# Patient Record
Sex: Female | Born: 1985 | Race: White | Hispanic: Yes | Smoking: Never smoker
Health system: Southern US, Community
[De-identification: ages and names within clinical notes are randomized; demographics above are authoritative.]

## PROBLEM LIST (undated history)

## (undated) ENCOUNTER — Inpatient Hospital Stay (HOSPITAL_COMMUNITY): Payer: Self-pay

## (undated) DIAGNOSIS — Z789 Other specified health status: Secondary | ICD-10-CM

## (undated) HISTORY — PX: NO PAST SURGERIES: SHX2092

---

## 2011-01-19 ENCOUNTER — Emergency Department (HOSPITAL_COMMUNITY)
Admission: EM | Admit: 2011-01-19 | Discharge: 2011-01-19 | Disposition: A | Discharge status: Home / Self Care | Payer: Self-pay | Source: Home / Self Care | Attending: Family Medicine | Admitting: Family Medicine

## 2011-01-19 ENCOUNTER — Encounter: Payer: Self-pay | Admitting: Cardiology

## 2011-01-19 DIAGNOSIS — N390 Urinary tract infection, site not specified: Secondary | ICD-10-CM

## 2011-01-19 MED ORDER — CEPHALEXIN 500 MG PO CAPS: 500.0000 mg | ORAL_CAPSULE | Freq: Four times a day (QID) | ORAL | Status: AC

## 2011-01-19 NOTE — ED Notes (Signed)
Pt reports burning on urination with urgency and frequency since Sunday. Denies fever.

## 2011-01-19 NOTE — ED Provider Notes (Signed)
History     CSN: [id number] Arrival date & time: 01/19/2011  7:06 PM   First MD Initiated Contact with Patient 01/19/11 1822      Chief Complaint  Patient presents with  . Urinary Urgency  . Urinary Frequency    (Consider location/radiation/quality/duration/timing/severity/associated sxs/prior treatment) Patient is a 25 y.o. female presenting with abdominal pain. The history is provided by the patient. A language interpreter was used.  Abdominal Pain The primary symptoms of the illness include abdominal pain and dysuria. The primary symptoms of the illness do not include fever, nausea, vomiting, diarrhea, vaginal discharge or vaginal bleeding. The current episode started more than 2 days ago. The onset of the illness was gradual. The problem has not changed since onset. The dysuria is associated with frequency and urgency.  The patient states that she believes she is currently not pregnant. The patient has not had a change in bowel habit. Additional symptoms associated with the illness include urgency, frequency and back pain.    History reviewed. No pertinent past medical history.  History reviewed. No pertinent past surgical history.  Family History  Problem Relation Age of Onset  . Diabetes Other     History  Substance Use Topics  . Smoking status: Never Smoker   . Smokeless tobacco: Not on file  . Alcohol Use: No    OB History    Grav Para Term Preterm Abortions TAB SAB Ect Mult Living                  Review of Systems  Constitutional: Negative for fever.  Gastrointestinal: Positive for abdominal pain. Negative for nausea, vomiting and diarrhea.  Genitourinary: Positive for dysuria, urgency and frequency. Negative for vaginal bleeding and vaginal discharge.  Musculoskeletal: Positive for back pain.    Allergies  Review of patient's allergies indicates no known allergies.  Home Medications   Current Outpatient Rx  Name Route Sig Dispense Refill  .  CEPHALEXIN 500 MG PO CAPS Oral Take 1 capsule (500 mg total) by mouth 4 (four) times daily. Take all of medicine and drink lots of fluids 20 capsule 0    BP 127/76  Pulse 85  Temp(Src) 98.4 F (36.9 C) (Oral)  Resp 16  SpO2 100%  LMP 01/05/2011  Physical Exam  Nursing note and vitals reviewed. Constitutional: She appears well-developed and well-nourished.  Abdominal: Soft. Bowel sounds are normal. There is tenderness in the suprapubic area. There is no rigidity, no rebound, no guarding and no CVA tenderness.    ED Course  Procedures (including critical care time)   Labs Reviewed  POCT PREGNANCY, URINE  POCT PREGNANCY, URINE  POCT URINALYSIS DIPSTICK   No results found.   1. UTI (lower urinary tract infection)       MDM  U/a abnl.,   preg neg.        Barkley Bruns, MD 01/19/11 [phone]

## 2011-01-20 LAB — POCT URINALYSIS DIP (DEVICE)
Bilirubin Urine: NEGATIVE
Glucose, UA: NEGATIVE mg/dL
Ketones, ur: NEGATIVE mg/dL
Nitrite: NEGATIVE
Specific Gravity, Urine: 1.015 (ref 1.005–1.030)

## 2011-06-12 ENCOUNTER — Encounter (HOSPITAL_COMMUNITY): Payer: Self-pay

## 2011-06-12 ENCOUNTER — Emergency Department (HOSPITAL_COMMUNITY)
Admission: EM | Admit: 2011-06-12 | Discharge: 2011-06-12 | Disposition: A | Discharge status: Home / Self Care | Payer: Self-pay | Source: Home / Self Care | Attending: Family Medicine | Admitting: Family Medicine

## 2011-06-12 DIAGNOSIS — L309 Dermatitis, unspecified: Secondary | ICD-10-CM

## 2011-06-12 DIAGNOSIS — L259 Unspecified contact dermatitis, unspecified cause: Secondary | ICD-10-CM

## 2011-06-12 DIAGNOSIS — B356 Tinea cruris: Secondary | ICD-10-CM

## 2011-06-12 MED ORDER — KETOCONAZOLE 2 % EX CREA: TOPICAL_CREAM | Freq: Every day | CUTANEOUS | Status: AC

## 2011-06-12 MED ORDER — MOMETASONE FUROATE 0.1 % EX CREA: TOPICAL_CREAM | Freq: Two times a day (BID) | CUTANEOUS | Status: AC

## 2011-06-12 MED ORDER — FLUCONAZOLE 150 MG PO TABS: ORAL_TABLET | ORAL | Status: AC

## 2011-06-12 NOTE — ED Provider Notes (Signed)
History     CSN: [id number]  Arrival date & time 06/12/11  4098   First MD Initiated Contact with Patient 06/12/11 1013      Chief Complaint  Patient presents with  . Rash    (Consider location/radiation/quality/duration/timing/severity/associated sxs/prior treatment) Patient is a 26 y.o. female presenting with rash. The history is provided by the patient and the spouse. The history is limited by a language barrier. No language interpreter was used.  Rash  This is a new problem. The current episode started more than 2 days ago. The problem has been gradually worsening. The problem is associated with nothing. There has been no fever. The rash is present on the left hand, right hand, left foot, right foot, genitalia, left upper leg and right upper leg. The patient is experiencing no pain. The pain has been worsening since onset. Associated symptoms include itching. She has tried nothing for the symptoms.    History reviewed. No pertinent past medical history.  History reviewed. No pertinent past surgical history.  Family History  Problem Relation Age of Onset  . Diabetes Other     History  Substance Use Topics  . Smoking status: Never Smoker   . Smokeless tobacco: Not on file  . Alcohol Use: No    OB History    Grav Para Term Preterm Abortions TAB SAB Ect Mult Living                  Review of Systems  HENT: Positive for congestion, sneezing and postnasal drip.   Skin: Positive for itching and rash.  All other systems reviewed and are negative.    Allergies  Review of patient's allergies indicates no known allergies.  Home Medications   Current Outpatient Rx  Name Route Sig Dispense Refill  . OVER THE COUNTER MEDICATION  Itching cream otc      LMP 05/25/2011  Physical Exam  Constitutional: She is oriented to person, place, and time. She appears well-developed and well-nourished.  HENT:  Head: Normocephalic.  Neck: Neck supple.  Musculoskeletal: Normal  range of motion.  Neurological: She is alert and oriented to person, place, and time.  Skin: Skin is warm and intact. Lesion and rash noted. There is erythema.       2 different types of rashes present. #1 feet and hands demonstrate tapioca-like lesions consistent with eczema. #2 over the genital and thigh area is a thickened red rash consistent with tinea cruiz Explained to patient that the genital rash has probably been going on for more than 3 or 4 days in husband confirms that she can has been going to the gym lately and sweating more.  Psychiatric: She has a normal mood and affect.    ED Course  Procedures (including critical care time)  Assessment and plan: Tinea cruiz and eczema. Will treat the tinea with Nizoral cream and one Diflucan tablet, we'll treat the eczema Elocon cream apply twice a day when necessary followup with PCP if not better in 2 weeks.Marland Kitchen      MDM          Hassan Rowan, MD 06/12/11 1141

## 2011-06-12 NOTE — ED Notes (Signed)
Pt has itchy rash on hands, feet and groin that started on Wednesday.

## 2011-06-12 NOTE — Discharge Instructions (Signed)
Prurito en la Ingle (Jock Itch) Prurito en la ingle es una infeccin por hongos en la piel de la ingle. A veces se lo denomina "culebrilla" pero no proviene de un gusano. Un hongo es un tipo de germen que crece en lugares oscuros y hmedos.  CAUSAS La infeccin podr diseminarse:  Por infeccin de hongos en cualquier parte del cuerpo (como el pie de atleta).   Compartir toallas o ropa.  Esta infeccin es ms frecuente en:  Climas clidos y hmedos.   Personas que [place] ropa [place] o trajes de bao hmedos por [employer].   Deportistas.   Personas con sobrepeso.   Personas con diabtes.  SNTOMAS Prurito en la ingle causa los siguientes sntomas:  Puntos rojos, rosados o marrones en la ingle. Puede expandirse a los muslos, nalgas y ano.   Picazn.  DIAGNSTICO El profesional hace el diagnstico a travs de la observacin de la erupcin. A veces se realiza un raspado de la piel para realizar un anlisis. El anlisis se obtiene mirando el microscopio o mediante un cultivo (prueba para Therapist, music). Este puede tomar [employer]. TRATAMIENTO Prurito en la ingle puede tratarse:  Cremas para la piel o pomadas para matar hongos.   Medicamentos por va oral que destruyen los hongos.   Cremas para la piel o pomadas para calmar la picazn.   Compresas o polvos con medicamentos para secar la piel infectada.  INSTRUCCIONES PARA EL CUIDADO DOMICILIARIO  Asegrese de que la erupcin desaparezca completamente con el tratamiento. Siga las indicaciones del mdico. Puede llevar un par de semanas completar la curacin. Si no se trata durante el tiempo suficiente, esta lesin Metallurgist.   Use ropas sueltas.   Los hombres deben usar boxers de algodn cortos.   Las mujeres deben usar ropa interior de algodn.   Evite los baos calientes.   Seque la zona de la ingle luego del bao.  SOLICITE ANTENCIN MDICA SI:  La erupcin empeora.   Se  extiende.   Aparece nuevamente luego de completar el tratamiento.   No se cura en el trmino de 4 semanas. Las infecciones micticas son lentas en responder al TEFL teacher. Persiste un enrojecimiento durante varias semanas despus de que el hongo haya desaparecido.  SOLICITE ATENCIN MDICA DE INMEDIATO SI:  La zona se vuelve roja, caliente, se hincha o duele.   Tiene fiebre.  Document Released: 10/27/2004 Document Revised: 01/06/2011 [facility] Patient Information 2012 [place], [place].Eczema (Eczema) El eczema, o dermatitis atpica, es un tipo heredado de piel sensible. Generalmente las personas que sufren eczema tienen una historia familiar de [place], asma o fiebre de heno. Este trastorno ocasiona una erupcin que pica y la piel se observa seca y escamosa. La picazn puede aparecer antes del sarpullido y puede ser muy intensa. Esta enfermedad no es contagiosa. El eczema generalmente empeora durante los meses fros del invierno y generalmente desaparece o mejora con el tiempo clido del verano. El eczema suele comenzar a mostrar signos en la infancia. Algunos nios desarrollan este trastorno y ste puede prolongarse en la Estate manager/land agent. Las causas de los brotes pueden ser:  Comer o tener contacto con algo a lo que se es Best boy.   El estrs.  DIAGNSTICO El diagnstico de eczema se basa generalmente en los sntomas y en la historia clnica. TRATAMIENTO El eczema no puede curarse, pero los sntomas podrn controlarse con tratamiento o evitando los alergenos (sustancias a las que es sensible o Best boy).  Controle la picazn y  el rascarse.   Utilice antihistamnicos de venta libre segn las indicaciones, para Associate Professor. Es especialmente til por las noches cuando la picazn tiende a Theme park manager.   Utilizar cremas esteorideas de venta libre segn se la haya indicado para la picazn.   Si se rasca, har que la erupcin y la picazn empeoren y esto puede causar imptigo (una infeccin de la  piel) si las uas estn contaminadas (sucias).   Mantener [employer] la piel hmeda con cremas. La piel quedar hmeda y ayudar a prevenir la sequedad. Las lociones que contienen alcohol y agua pueden secar la piel y no se recomiendan.   Limite la exposicin a alergenos.   Reconozca las situaciones que producen estrs.   Desarrolle un plan para controlar el estrs.  INSTRUCCIONES PARA EL CUIDADO DOMICILIARIO  Tome slo medicamentos de venta libre o prescriptos, segn las indicaciones del mdico.   No utilice ningn producto en la piel sin consultarlo antes con el profesional.   El nio deber tomar baos o duchas de corta duracin (5 minutos) en agua templada (no caliente). Use productos suaves para el bao. Puede agregar aceite de bao no perfumado al agua del bao. Lo mejor es evitar el jabn y el bao de espuma.   Inmediatamente despus del bao o de la ducha, cuando la piel an est hmeda, aplique una crema humectante en todo el cuerpo. Esta crema debe ser Neomia Dear pomada de vaselina. La piel quedar hmeda y ayudar a prevenir la sequedad. Cundo ms espesa sea la crema, mejor. No deben ser perfumadas.   Mantengas las uas cortas y lvese las manos con frecuencia. Si el nio tiene eczema, podr ser [employer] coloque unos guantes o mitones suaves a la noche.   Vista al [employer] con ropa de algodn o Chief of Staff de algodn. Pngale ropas livianas, ya que el calor aumenta la picazn.   Evite los alimentos que le producen [place]. Entre los [employer] pueden causar un brote se incluyen la [place], la [place] de man, los huevos y el trigo.   Mantenga al nio lejos de quien tenga ampollas febriles. El virus que causa las ampollas febriles (herpes simple) puede ocasionar una infeccin grave en la piel de los nios que padecen eczema.  SOLICITE ATENCIN MDICA SI:  La picazn le impide dormir.   La erupcin empeora o no mejora dentro de la semana en la que se inicia el  [name].   La erupcin se ve infectada (pus o costras de color amarillo claro).   Usted o su hijo tienen una temperatura oral de ms de 102 F (38.9 C).   El beb tiene ms de 3 meses y su temperatura rectal es de 100.5 F (38.1 C) o ms durante ms de 1 da.   Aparece un brote despus de haber estado en contacto con alguna persona que tiene ampollas febriles.  SOLICITE ATENCIN MDICA DE INMEDIATO SI:  Su beb tiene ms de 3 meses y su temperatura rectal es de 102 F (38.9 C) o ms.   Su beb tiene 3 meses o menos y su temperatura rectal es de 100.4 F (38 C) o ms.  Document Released: 01/17/2005 Document Revised: 01/06/2011 [facility] Patient Information 2012 [place], [place].

## 2012-08-04 ENCOUNTER — Inpatient Hospital Stay (HOSPITAL_COMMUNITY)
Admission: AD | Admit: 2012-08-04 | Discharge: 2012-08-04 | Disposition: A | Discharge status: Home / Self Care | Payer: Self-pay | Source: Ambulatory Visit | Attending: Obstetrics & Gynecology | Admitting: Obstetrics & Gynecology

## 2012-08-04 ENCOUNTER — Encounter (HOSPITAL_COMMUNITY): Payer: Self-pay | Admitting: *Deleted

## 2012-08-04 ENCOUNTER — Inpatient Hospital Stay (HOSPITAL_COMMUNITY): Payer: Self-pay

## 2012-08-04 DIAGNOSIS — Z1389 Encounter for screening for other disorder: Secondary | ICD-10-CM

## 2012-08-04 DIAGNOSIS — O99891 Other specified diseases and conditions complicating pregnancy: Secondary | ICD-10-CM | POA: Insufficient documentation

## 2012-08-04 DIAGNOSIS — Z3490 Encounter for supervision of normal pregnancy, unspecified, unspecified trimester: Secondary | ICD-10-CM

## 2012-08-04 DIAGNOSIS — R109 Unspecified abdominal pain: Secondary | ICD-10-CM | POA: Insufficient documentation

## 2012-08-04 HISTORY — DX: Other specified health status: Z78.9

## 2012-08-04 LAB — URINE MICROSCOPIC-ADD ON

## 2012-08-04 LAB — CBC WITH DIFFERENTIAL/PLATELET
Basophils Relative: 0 % (ref 0–1)
Eosinophils Absolute: 0.3 10*3/uL (ref 0.0–0.7)
MCH: 31.1 pg (ref 26.0–34.0)
MCHC: 35.5 g/dL (ref 30.0–36.0)
Neutrophils Relative %: 69 % (ref 43–77)
Platelets: 231 10*3/uL (ref 150–400)
RBC: 4.4 MIL/uL (ref 3.87–5.11)

## 2012-08-04 LAB — URINALYSIS, ROUTINE W REFLEX MICROSCOPIC
Nitrite: NEGATIVE
Specific Gravity, Urine: 1.01 (ref 1.005–1.030)
Urobilinogen, UA: 0.2 mg/dL (ref 0.0–1.0)
pH: 6 (ref 5.0–8.0)

## 2012-08-04 LAB — WET PREP, GENITAL
Trich, Wet Prep: NONE SEEN
Yeast Wet Prep HPF POC: NONE SEEN

## 2012-08-04 LAB — POCT PREGNANCY, URINE: Preg Test, Ur: POSITIVE — AB

## 2012-08-04 LAB — HCG, QUANTITATIVE, PREGNANCY: hCG, Beta Chain, Quant, S: 22027 m[IU]/mL — ABNORMAL HIGH (ref <5)

## 2012-08-04 NOTE — MAU Provider Note (Addendum)
History     CSN: [id number]  Arrival date and time: 08/04/12 1478   First Provider Initiated Contact with Patient 08/04/12 2008      Chief Complaint  Patient presents with  . Abdominal Pain   HPI Tracie [name] is 27 y.o. G2P1 Unknown weeks presenting with + HPT 2 weeks ago.  Presents with "coffee color" discharge X 2 days; cramping X 2 weeks but stronger for 2 days.  Denies active vaginal bleeding. Last intercourse 8 days ago.   Denies UTI sxs.   Off OCPs 1 year ago without period since.  Hx of irregular cycles.  Interpreter in for HPI and exam.  Past Medical History  Diagnosis Date  . Medical history non-contributory     Past Surgical History  Procedure Laterality Date  . No past surgeries      Family History  Problem Relation Age of Onset  . Diabetes Other     History  Substance Use Topics  . Smoking status: Never Smoker   . Smokeless tobacco: Not on file  . Alcohol Use: No    Allergies: No Known Allergies  Prescriptions prior to admission  Medication Sig Dispense Refill  . OVER THE COUNTER MEDICATION Itching cream otc        Review of Systems  Constitutional: Negative for fever and chills.  Gastrointestinal: Positive for abdominal pain (cramping). Negative for nausea and vomiting.  Genitourinary: Negative for dysuria, urgency, frequency, hematuria and flank pain.       + vaginal bleeding  Neurological: Negative for headaches.   Physical Exam   Blood pressure 124/78, pulse 81, temperature 98 F (36.7 C), resp. rate 18, height 5' 1.5" (1.562 m), weight 167 lb 12.8 oz (76.114 kg), SpO2 100.00%.  Physical Exam  Constitutional: She is oriented to person, place, and time. She appears well-developed and well-nourished. No distress.  HENT:  Head: Normocephalic.  Neck: Normal range of motion.  Cardiovascular: Normal rate.   Respiratory: Effort normal.  GI: Soft. She exhibits no distension and no mass. There is no tenderness. There is no rebound and no guarding.   Genitourinary: There is no rash, tenderness or injury on the right labia. There is no rash, tenderness, lesion or injury on the left labia. Uterus is enlarged (soft but small). Uterus is not tender. Right adnexum displays no mass, no tenderness and no fullness. Left adnexum displays no mass, no tenderness and no fullness. There is bleeding (small amount of pinkish brown discharge,  CLumps of discharge without odor.) around the vagina. No erythema or tenderness around the vagina. No foreign body around the vagina. No signs of injury around the vagina. No vaginal discharge found.  There is a small abrasion that is red on the cervix at 6:00.    Neurological: She is alert and oriented to person, place, and time.  Skin: Skin is warm and dry.  Psychiatric: She has a normal mood and affect. Her behavior is normal.   Results for orders placed during the hospital encounter of 08/04/12 (from the past 24 hour(s))  URINALYSIS, ROUTINE W REFLEX MICROSCOPIC     Status: Abnormal   Collection Time    08/04/12  7:40 PM      Result Value Range   Color, Urine YELLOW  YELLOW   APPearance CLEAR  CLEAR   Specific Gravity, Urine 1.010  1.005 - 1.030   pH 6.0  5.0 - 8.0   Glucose, UA NEGATIVE  NEGATIVE mg/dL   Hgb urine dipstick MODERATE (*) NEGATIVE  Bilirubin Urine NEGATIVE  NEGATIVE   Ketones, ur NEGATIVE  NEGATIVE mg/dL   Protein, ur NEGATIVE  NEGATIVE mg/dL   Urobilinogen, UA 0.2  0.0 - 1.0 mg/dL   Nitrite NEGATIVE  NEGATIVE   Leukocytes, UA SMALL (*) NEGATIVE  URINE MICROSCOPIC-ADD ON     Status: None   Collection Time    08/04/12  7:40 PM      Result Value Range   Squamous Epithelial / LPF RARE  RARE   WBC, UA 3-6  <3 WBC/hpf   RBC / HPF 3-6  <3 RBC/hpf   Bacteria, UA RARE  RARE   Urine-Other YEAST    POCT PREGNANCY, URINE     Status: Abnormal   Collection Time    08/04/12  7:46 PM      Result Value Range   Preg Test, Ur POSITIVE (*) NEGATIVE  CBC WITH DIFFERENTIAL     Status: Abnormal    Collection Time    08/04/12  8:12 PM      Result Value Range   WBC 12.6 (*) 4.0 - 10.5 K/uL   RBC 4.40  3.87 - 5.11 MIL/uL   Hemoglobin 13.7  12.0 - 15.0 g/dL   HCT 47.8  29.5 - 62.1 %   MCV 87.7  78.0 - 100.0 fL   MCH 31.1  26.0 - 34.0 pg   MCHC 35.5  30.0 - 36.0 g/dL   RDW 30.8  65.7 - 84.6 %   Platelets 231  150 - 400 K/uL   Neutrophils Relative % 69  43 - 77 %   Neutro Abs 8.8 (*) 1.7 - 7.7 K/uL   Lymphocytes Relative 23  12 - 46 %   Lymphs Abs 3.0  0.7 - 4.0 K/uL   Monocytes Relative 5  3 - 12 %   Monocytes Absolute 0.6  0.1 - 1.0 K/uL   Eosinophils Relative 2  0 - 5 %   Eosinophils Absolute 0.3  0.0 - 0.7 K/uL   Basophils Relative 0  0 - 1 %   Basophils Absolute 0.0  0.0 - 0.1 K/uL  WET PREP, GENITAL     Status: Abnormal   Collection Time    08/04/12  8:15 PM      Result Value Range   Yeast Wet Prep HPF POC NONE SEEN  NONE SEEN   Trich, Wet Prep NONE SEEN  NONE SEEN   Clue Cells Wet Prep HPF POC NONE SEEN  NONE SEEN   WBC, Wet Prep HPF POC FEW (*) NONE SEEN  HCG, QUANTITATIVE, PREGNANCY     Status: Abnormal   Collection Time    08/04/12  8:21 PM      Result Value Range   hCG, Beta Chain, Quant, S 22027 (*) <5 mIU/mL  ABO/RH     Status: None   Collection Time    08/04/12  8:21 PM      Result Value Range   ABO/RH(D) O POS     MAU Course  Procedures  GC/CHL culture to lab  MDM Care turned over to [place], CNM at [facility]  Assessment and Plan    KEY,EVE M 08/04/2012, 8:48 PM   US Ob Comp Less 14 Wks  08/04/2012   *RADIOLOGY REPORT*  Clinical Data: Pelvic pain for 2 weeks.  Brown discharge. Quantitative beta HCG is 22,027.  Unknown last menstrual period.  OBSTETRIC <14 WK [place] AND TRANSVAGINAL OB US  Technique:  Both transabdominal and transvaginal ultrasound examinations were performed for complete  evaluation of the gestation as well as the maternal uterus, adnexal regions, and pelvic cul-de-sac.  Transvaginal technique was performed to assess early pregnancy.   Comparison:  None.  Intrauterine gestational sac:  A single intrauterine gestational sac is visualized. Yolk sac: The yolk sac is visualized. Embryo: Provisional identification of a fetal pole, not well seen. Cardiac Activity: Possible identification of fetal cardiac activity. Heart Rate: 53 - 75 bpm  MSD: 16 mm  6 w 3 d           USEDC: 03/27/2013  CRL: 2.1  mm  5 w  6 d        [place] EDC: 03/31/2013  Maternal uterus/adnexae: No focal myometrial masses.  Uterus is anteverted.  No subchorionic hemorrhage.  Both ovaries are visualized without abnormal adnexal mass.  Right ovary measures 3.3 x 2.9 x 2.7 cm.  Left ovary measures 2.8 x 1.8 x 2.3 cm.  IMPRESSION: Single intrauterine gestational sac and yolk sac are visualized. Provisional identification of a small fetal pole and probable fetal cardiac activity.  Estimated gestational age is between 5-week 6 days and 6 weeks 3 days.   Original Report Authenticated By: Burman Nieves, M.D.   US Ob Transvaginal  08/04/2012   *RADIOLOGY REPORT*  Clinical Data: Pelvic pain for 2 weeks.  Brown discharge. Quantitative beta HCG is 22,027.  Unknown last menstrual period.  OBSTETRIC <14 WK [place] AND TRANSVAGINAL OB US  Technique:  Both transabdominal and transvaginal ultrasound examinations were performed for complete evaluation of the gestation as well as the maternal uterus, adnexal regions, and pelvic cul-de-sac.  Transvaginal technique was performed to assess early pregnancy.  Comparison:  None.  Intrauterine gestational sac:  A single intrauterine gestational sac is visualized. Yolk sac: The yolk sac is visualized. Embryo: Provisional identification of a fetal pole, not well seen. Cardiac Activity: Possible identification of fetal cardiac activity. Heart Rate: 53 - 75 bpm  MSD: 16 mm  6 w 3 d           USEDC: 03/27/2013  CRL: 2.1  mm  5 w  6 d        [place] EDC: 03/31/2013  Maternal uterus/adnexae: No focal myometrial masses.  Uterus is anteverted.  No subchorionic hemorrhage.  Both  ovaries are visualized without abnormal adnexal mass.  Right ovary measures 3.3 x 2.9 x 2.7 cm.  Left ovary measures 2.8 x 1.8 x 2.3 cm.  IMPRESSION: Single intrauterine gestational sac and yolk sac are visualized. Provisional identification of a small fetal pole and probable fetal cardiac activity.  Estimated gestational age is between 5-week 6 days and 6 weeks 3 days.   Original Report Authenticated By: Burman Nieves, M.D.    A: 1. Normal IUP (intrauterine pregnancy) on prenatal ultrasound    P: D/C home with bleeding precautions F/U with early prenatal care.  Pt plans to start care at health dept. Return to MAU as needed [facility] Spanish translator used for all communication.   Sharen Counter Certified Nurse-Midwife

## 2012-08-04 NOTE — MAU Note (Signed)
Stopped taking birth control pills a year ago has had no periods since stopped taking, was on them for four years

## 2012-08-04 NOTE — MAU Note (Signed)
I'm having lower abd pain, esp when i sit. Some coffee colored vag d/c. Pain has been present for couple wks but worse past 2 days. Vag d/c started yesterday afternoon.

## 2012-10-08 ENCOUNTER — Other Ambulatory Visit (HOSPITAL_COMMUNITY): Payer: Self-pay | Admitting: Physician Assistant

## 2012-10-08 DIAGNOSIS — Z3689 Encounter for other specified antenatal screening: Secondary | ICD-10-CM

## 2012-10-25 ENCOUNTER — Encounter: Payer: Self-pay | Admitting: Obstetrics & Gynecology

## 2012-10-25 ENCOUNTER — Encounter: Payer: Self-pay | Admitting: *Deleted

## 2012-10-25 ENCOUNTER — Ambulatory Visit (INDEPENDENT_AMBULATORY_CARE_PROVIDER_SITE_OTHER): Payer: No Typology Code available for payment source | Admitting: Obstetrics & Gynecology

## 2012-10-25 VITALS — BP 118/71 | Temp 97.8°F | Wt 167.0 lb

## 2012-10-25 DIAGNOSIS — O09899 Supervision of other high risk pregnancies, unspecified trimester: Secondary | ICD-10-CM

## 2012-10-25 DIAGNOSIS — O0992 Supervision of high risk pregnancy, unspecified, second trimester: Secondary | ICD-10-CM | POA: Insufficient documentation

## 2012-10-25 NOTE — Patient Instructions (Signed)
Embarazo - Segundo trimestre (Pregnancy - Second Trimester) El segundo trimestre del embarazo (del 3 al 6 mes) es un perodo de evolucin rpida para usted y el beb. Hacia el final del sexto mes, el beb mide aproximadamente 23 cm y pesa 680 g. Comenzar a sentir los movimientos del beb entre las 18 y las 20 semanas de embarazo. Podr sentir las pataditas ("quickening en ingls"). Hay un rpido aumento de peso. Puede segregar un lquido claro (calostro) de las mamas. Quizs sienta pequeas contracciones en el vientre (tero) Esto se conoce como falso trabajo de parto o contracciones de Braxton-Hicks. Es como una prctica del trabajo de parto que se produce cuando el beb est listo para salir. Generalmente los problemas de vmitos matinales ya se han superado hacia el final del primer trimestre. Algunas mujeres desarrollan pequeas manchas oscuras (que se denominan cloasma, mscara del embarazo) en la cara que normalmente se van luego del nacimiento del beb. La exposicin al sol empeora las manchas. Puede desarrollarse acn en algunas mujeres embarazadas, y puede desaparecer en aquellas que ya tienen acn. EXAMENES PRENATALES  Durante los exmenes prenatales, deber seguir realizando pruebas de sangre, segn avance el embarazo. Estas pruebas se realizan para controlar su salud y la del beb. Tambin se realizan anlisis de sangre para conocer los niveles de hemoglobina. La anemia (bajo nivel de hemoglobina) es frecuente durante el embarazo. Para prevenirla, se administran hierro y vitaminas. Tambin se le realizarn exmenes para saber si tiene diabetes entre las 24 y las 28 semanas del embarazo. Podrn repetirle algunas de las pruebas que le hicieron previamente.  En cada visita le medirn el tamao del tero. Esto se realiza para asegurarse de que el beb est creciendo correctamente de acuerdo al estado del embarazo.  Tambin en cada visita prenatal controlarn su presin arterial. Esto se realiza  para asegurarse de que no tenga toxemia.  Se controlar su orina para asegurarse de que no tenga infecciones, diabetes o protena en la orina.  Se controlar su peso regularmente para asegurarse que el aumento ocurre al ritmo indicado. Esto se hace para asegurarse que usted y el beb tienen una evolucin normal.  En algunas ocasiones se realiza una prueba de ultrasonido para confirmar el correcto desarrollo y evolucin del beb. Esta prueba se realiza con ondas sonoras inofensivas para el beb, de modo que el profesional pueda calcular ms precisamente la fecha del parto. Algunas veces se realizan pruebas especializadas del lquido amnitico que rodea al beb. Esta prueba se denomina amniocentesis. El lquido amnitico se obtiene introduciendo una aguja en el vientre (abdomen). Se realiza para controlar los cromosomas en aquellos casos en los que existe alguna preocupacin acerca de algn problema gentico que pueda sufrir el beb. En ocasiones se lleva a cabo cerca del final del embarazo, si es necesario inducir al parto. En este caso se realiza para asegurarse que los pulmones del beb estn lo suficientemente maduros como para que pueda vivir fuera del tero. CAMBIOS QUE OCURREN EN EL SEGUNDO TRIMESTRE DEL EMBARAZO Su organismo atravesar numerosos cambios durante el embarazo. Estos pueden variar de una persona a otra. Converse con el profesional que la asiste acerca los cambios que usted note y que la preocupen.  Durante el segundo trimestre probablemente sienta un aumento del apetito. Es normal tener "antojos" de ciertas comidas. Esto vara de una persona a otra y de un embarazo a otro.  El abdomen inferior comenzar a abultarse.  Podr tener la necesidad de orinar con ms frecuencia debido a   que el tero y el beb presionan sobre la vejiga. Tambin es frecuente contraer ms infecciones urinarias durante el embarazo. Puede evitarlas bebiendo gran cantidad de lquidos y vaciando la vejiga antes y  despus de mantener relaciones sexuales.  Podrn aparecer las primeras estras en las caderas, abdomen y mamas. Estos son cambios normales del cuerpo durante el embarazo. No existen medicamentos ni ejercicios que puedan prevenir estos cambios.  Es posible que comience a desarrollar venas inflamadas y abultadas (varices) en las piernas. El uso de medias de descanso, elevar sus pies durante 15 minutos, 3 a 4 veces al da y limitar la sal en su dieta ayuda a aliviar el problema.  Podr sentir acidez gstrica a medida que el tero crece y presiona contra el estmago. Puede tomar anticidos, con la autorizacin de su mdico, para aliviar este problema. Tambin es til ingerir pequeas comidas 4 a 5 veces al da.  La constipacin puede tratarse con un laxante o agregando fibra a su dieta. Beber grandes cantidades de lquidos, comer vegetales, frutas y granos integrales es de gran ayuda.  Tambin es beneficioso practicar actividad fsica. Si ha sido una persona activa hasta el embarazo, podr continuar con la mayora de las actividades durante el mismo. Si ha sido menos activa, puede ser beneficioso que comience con un programa de ejercicios, como realizar caminatas.  Puede desarrollar hemorroides hacia el final del segundo trimestre. Tomar baos de asiento tibios y utilizar cremas recomendadas por el profesional que lo asiste sern de ayuda para los problemas de hemorroides.  Tambin podr sentir dolor de espalda durante este momento de su embarazo. Evite levantar objetos pesados, utilice zapatos de taco bajo y mantenga una buena postura para ayudar a reducir los problemas de espalda.  Algunas mujeres embarazadas desarrollan hormigueo y adormecimiento de la mano y los dedos debido a la hinchazn y compresin de los ligamentos de la mueca (sndrome del tnel carpiano). Esto desaparece una vez que el beb nace.  Como sus pechos se agrandan, necesitar un sujetador ms grande. Use un sostn de soporte,  cmodo y de algodn. No utilice un sostn para amamantar hasta el ltimo mes de embarazo si va a amamantar al beb.  Podr observar una lnea oscura desde el ombligo hacia la zona pbica denominada linea nigra.  Podr observar que sus mejillas se ponen coloradas debido al aumento de flujo sanguneo en la cara.  Podr desarrollar "araitas" en la cara, cuello y pecho. Esto desaparece una vez que el beb nace. INSTRUCCIONES PARA EL CUIDADO DOMICILIARIO  Es extremadamente importante que evite el cigarrillo, hierbas medicinales, alcohol y las drogas no prescriptas durante el embarazo. Estas sustancias qumicas afectan la formacin y el desarrollo del beb. Evite estas sustancias durante todo el embarazo para asegurar el nacimiento de un beb sano.  La mayor parte de los cuidados que se aconsejan son los mismos que los indicados para el primer trimestre del embarazo. Cumpla con las citas tal como se le indic. Siga las instrucciones del profesional que lo asiste con respecto al uso de los medicamentos, el ejercicio y la dieta.  Durante el embarazo debe obtener nutrientes para usted y para su beb. Consuma alimentos balanceados a intervalos regulares. Elija alimentos como carne, pescado, leche y otros productos lcteos descremados, vegetales, frutas, panes integrales y cereales. El profesional le informar cul es el aumento de peso ideal.  Las relaciones sexuales fsicas pueden continuarse hasta cerca del fin del embarazo si no existen otros problemas. Estos problemas pueden ser la prdida temprana (  prematura) de lquido amnitico de las membranas, sangrado vaginal, dolor abdominal u otros problemas mdicos o del embarazo.  Realice actividad fsica todos los das, si no tiene restricciones. Consulte con el profesional que la asiste si no sabe con certeza si determinados ejercicios son seguros. El mayor aumento de peso tiene lugar durante los ltimos 2 trimestres del embarazo. El ejercicio la ayudar  a:  Controlar su peso.  Ponerla en forma para el parto.  Ayudarla a perder peso luego de haber dado a luz.  Use un buen sostn o como los que se usan para hacer deportes para aliviar la sensibilidad de las mamas. Tambin puede serle til si lo usa mientras duerme. Si pierde calostro, podr utilizar apsitos en el sostn.  No utilice la baera con agua caliente, baos turcos y saunas durante el embarazo.  Utilice el cinturn de seguridad sin excepcin cuando conduzca. Este la proteger a usted y al beb en caso de accidente.  Evite comer carne cruda, queso crudo, y el contacto con los utensilios y desperdicios de los gatos. Estos elementos contienen grmenes que pueden causar defectos de nacimiento en el beb.  El segundo trimestre es un buen momento para visitar a su dentista y evaluar su salud dental si an no lo ha hecho. Es importante mantener los dientes limpios. Utilice un cepillo de dientes blando. Cepllese ms suavemente durante el embarazo.  Es ms fcil perder algo de orina durante el embarazo. Apretar y fortalecer los msculos de la pelvis la ayudar con este problema. Practique detener la miccin cuando est en el bao. Estos son los mismos msculos que necesita fortalecer. Son tambin los mismos msculos que utiliza cuando trata de evitar los gases. Puede practicar apretando estos msculos 10 veces, y repetir esto 3 veces por da aproximadamente. Una vez que conozca qu msculos debe apretar, no realice estos ejercicios durante la miccin. Puede favorecerle una infeccin si la orina vuelve hacia atrs.  Pida ayuda si tiene necesidades econmicas, de asesoramiento o nutricionales durante el embarazo. El profesional podr ayudarla con respecto a estas necesidades, o derivarla a otros especialistas.  La piel puede ponerse grasa. Si esto sucede, lvese la cara con un jabn suave, utilice un humectante no graso y maquillaje con base de aceite o crema. CONSUMO DE MEDICAMENTOS Y DROGAS  DURANTE EL EMBARAZO  Contine tomando las vitaminas apropiadas para esta etapa tal como se le indic. Las vitaminas deben contener un miligramo de cido flico y deben suplementarse con hierro. Guarde todas las vitaminas fuera del alcance de los nios. La ingestin de slo un par de vitaminas o tabletas que contengan hierro puede ocasionar la muerte en un beb o en un nio pequeo.  Evite el uso de medicamentos, inclusive los de venta libre y hierbas que no hayan sido prescriptos o indicados por el profesional que la asiste. Algunos medicamentos pueden causar problemas fsicos al beb. Utilice los medicamentos de venta libre o de prescripcin para el dolor, el malestar o la fiebre, segn se lo indique el profesional que lo asiste. No utilice aspirina.  El consumo de alcohol est relacionado con ciertos defectos de nacimiento. Esto incluye el sndrome de alcoholismo fetal. Debe evitar el consumo de alcohol en cualquiera de sus formas. El cigarrillo causa nacimientos prematuros y bebs de bajo peso. El uso de drogas recreativas est absolutamente prohibido. Son muy nocivas para el beb. Un beb que nace de una madre adicta, ser adicto al nacer. Ese beb tendr los mismos sntomas de abstinencia que un adulto.    Infrmele al profesional si consume alguna droga.  No consuma drogas ilegales. Pueden causarle mucho dao al beb. SOLICITE ATENCIN MDICA SI: Tiene preguntas o preocupaciones durante su embarazo. Es mejor que llame para consultar las dudas que esperar hasta su prxima visita prenatal. De esta forma se sentir ms tranquila.  SOLICITE ATENCIN MDICA DE INMEDIATO SI:  La temperatura oral se eleva sin motivo por encima de 102 F (38.9 C) o segn le indique el profesional que lo asiste.  Tiene una prdida de lquido por la vagina (canal de parto). Si sospecha una ruptura de las membranas, tmese la temperatura y llame al profesional para informarlo sobre esto.  Observa unas pequeas manchas,  una hemorragia vaginal o elimina cogulos. Notifique al profesional acerca de la cantidad y de cuntos apsitos est utilizando. Unas pequeas manchas de sangre son algo comn durante el embarazo, especialmente despus de mantener relaciones sexuales.  Presenta un olor desagradable en la secrecin vaginal y observa un cambio en el color, de transparente a blanco.  Contina con las nuseas y no obtiene alivio de los remedios indicados. Vomita sangre o algo similar a la borra del caf.  [place] o sube ms de 900 g. en una semana, o segn lo indicado por el profesional que la asiste.  Observa que se le hinchan el rostro, las manos, los pies o las piernas.  Ha estado expuesta a la rubola y no ha sufrido la enfermedad.  Ha estado expuesta a la quinta enfermedad o a la varicela.  Presenta dolor abdominal. Las molestias en el ligamento redondo son una causa no cancerosa (benigna) frecuente de dolor abdominal durante el embarazo. El profesional que la asiste deber evaluarla.  Presenta dolor de cabeza intenso que no se alivia.  Presenta fiebre, diarrea, dolor al orinar o le falta la respiracin.  Presenta dificultad para ver, visin borrosa, o visin doble.  Sufre una cada, un accidente de trnsito o cualquier tipo de trauma.  Vive en un hogar en el que existe violencia fsica o mental. Document Released: 10/27/2004 Document Revised: 10/12/2011 [employer] Patient Information 2014 [employer].  

## 2012-10-25 NOTE — Progress Notes (Signed)
Pulse: 99 

## 2012-10-25 NOTE — Progress Notes (Signed)
Nutrition note: 1st visit consult Pt has gained 3# @ [redacted]w[redacted]d, which is wnl.  Pt reports eating 2 meals & 2-3 snacks/d. Pt is taking PNV. Pt reports no N/V but has some heartburn. Pt is allergic to pork. Pt reports walking 20-30 mins/ d. Pt received verbal & written education on general nutrition during pregnancy. Encouraged a protein source with all meals & snacks. Discussed tips to decrease heartburn. Discussed wt gain goals of 11-20# or 0.5#/wk. Pt agrees to continue taking PNV. Pt has WIC & plans to BF. F/u if referred Blondell Reveal, MS, RD, LDN

## 2012-10-25 NOTE — Progress Notes (Signed)
   Subjective: referred by HD SGA infant G1    Tracie [name] is a G2P1001 [redacted]w[redacted]d being seen today for her first obstetrical visit.  Her obstetrical history is significant for SGA 5.5 lb baby 8 yr ago in [place]. Patient does intend to breast feed. Pregnancy history fully reviewed.  Patient reports no complaints.  Filed Vitals:   10/25/12 0820  BP: 118/71  Temp: 97.8 F (36.6 C)  Weight: 167 lb (75.751 kg)    HISTORY: OB History  Gravida Para Term Preterm AB SAB TAB Ectopic Multiple Living  2 1 1       1     # Outcome Date GA Lbr Len/2nd Weight Sex Delivery Anes PTL Lv  2 CUR           1 TRM  [redacted]w[redacted]d    SVD        Past Medical History  Diagnosis Date  . Medical history non-contributory    Past Surgical History  Procedure Laterality Date  . No past surgeries     Family History  Problem Relation Age of Onset  . Diabetes Other   . Diabetes Mother   . Diabetes Paternal Aunt   . Diabetes Paternal Grandmother      Exam    Uterus:     Pelvic Exam:    Perineum: No Hemorrhoids   Vulva: normal   Vagina:  normal mucosa   pH:     Cervix: ectropion   Adnexa: no mass, fullness, tenderness   Bony Pelvis: average  System: Breast:  normal appearance, no masses or tenderness   Skin: normal coloration and turgor, no rashes    Neurologic: oriented, normal mood   Extremities: normal strength, tone, and muscle mass   HEENT thyroid without masses   Mouth/Teeth mucous membranes moist, pharynx normal without lesions and dental hygiene good   Neck supple   Cardiovascular: regular rate and rhythm, no murmurs or gallops   Respiratory:  appears well, vitals normal, no respiratory distress, acyanotic, normal RR, neck free of mass or lymphadenopathy, chest clear, no wheezing, crepitations, rhonchi, normal symmetric air entry   Abdomen: soft, non-tender; bowel sounds normal; no masses,  no organomegaly   Urinary: urethral meatus normal      Assessment:    Pregnancy: G2P1001 Patient Active  Problem List   Diagnosis Date Noted  . Supervision of high risk pregnancy in second trimester 10/25/2012        Plan:     Initial labs drawn. Prenatal vitamins. Problem list reviewed and updated. Genetic Screening discussed Quad Screen: ordered.  Ultrasound discussed; fetal survey: ordered.  Follow up in 4 weeks. 50% of 30 min visit spent on counseling and coordination of care.  Need pap record   Tracie [name] 10/25/2012

## 2012-10-26 LAB — AFP, QUAD SCREEN
AFP: 36.2 IU/mL
HCG, Total: 28646 m[IU]/mL
Interpretation-AFP: NEGATIVE
MoM for AFP: 1.1
Open Spina bifida: NEGATIVE
uE3 Mom: 1.25

## 2012-10-26 LAB — GC/CHLAMYDIA PROBE AMP: CT Probe RNA: NEGATIVE

## 2012-10-27 ENCOUNTER — Encounter: Payer: Self-pay | Admitting: *Deleted

## 2012-10-29 ENCOUNTER — Encounter: Payer: Self-pay | Admitting: *Deleted

## 2012-10-29 ENCOUNTER — Other Ambulatory Visit (HOSPITAL_COMMUNITY): Payer: Self-pay | Admitting: Physician Assistant

## 2012-10-29 ENCOUNTER — Ambulatory Visit (HOSPITAL_COMMUNITY)
Admission: RE | Admit: 2012-10-29 | Discharge: 2012-10-29 | Disposition: A | Discharge status: Home / Self Care | Payer: No Typology Code available for payment source | Source: Ambulatory Visit | Attending: Obstetrics & Gynecology | Admitting: Obstetrics & Gynecology

## 2012-10-29 ENCOUNTER — Ambulatory Visit (HOSPITAL_COMMUNITY)
Admission: RE | Admit: 2012-10-29 | Discharge: 2012-10-29 | Disposition: A | Discharge status: Home / Self Care | Payer: No Typology Code available for payment source | Source: Ambulatory Visit | Attending: Physician Assistant | Admitting: Physician Assistant

## 2012-10-29 DIAGNOSIS — Z3689 Encounter for other specified antenatal screening: Secondary | ICD-10-CM

## 2012-10-29 DIAGNOSIS — Z1389 Encounter for screening for other disorder: Secondary | ICD-10-CM | POA: Insufficient documentation

## 2012-10-29 DIAGNOSIS — O358XX0 Maternal care for other (suspected) fetal abnormality and damage, not applicable or unspecified: Secondary | ICD-10-CM | POA: Insufficient documentation

## 2012-10-29 DIAGNOSIS — Z363 Encounter for antenatal screening for malformations: Secondary | ICD-10-CM | POA: Insufficient documentation

## 2012-10-29 DIAGNOSIS — O365990 Maternal care for other known or suspected poor fetal growth, unspecified trimester, not applicable or unspecified: Secondary | ICD-10-CM | POA: Insufficient documentation

## 2012-11-22 ENCOUNTER — Encounter: Payer: Self-pay | Admitting: Family

## 2012-11-22 ENCOUNTER — Ambulatory Visit (INDEPENDENT_AMBULATORY_CARE_PROVIDER_SITE_OTHER): Payer: No Typology Code available for payment source | Admitting: Family

## 2012-11-22 VITALS — BP 119/74 | Temp 97.1°F | Wt 169.5 lb

## 2012-11-22 DIAGNOSIS — O09899 Supervision of other high risk pregnancies, unspecified trimester: Secondary | ICD-10-CM

## 2012-11-22 DIAGNOSIS — O0992 Supervision of high risk pregnancy, unspecified, second trimester: Secondary | ICD-10-CM

## 2012-11-22 LAB — POCT URINALYSIS DIP (DEVICE)
Bilirubin Urine: NEGATIVE
Ketones, ur: NEGATIVE mg/dL
Protein, ur: NEGATIVE mg/dL
Specific Gravity, Urine: 1.015 (ref 1.005–1.030)
Urobilinogen, UA: 0.2 mg/dL (ref 0.0–1.0)
pH: 7.5 (ref 5.0–8.0)

## 2012-11-22 NOTE — Progress Notes (Signed)
Reports baby not moving a lot during day, notices movement at night.  Intermittent pelvic pain, "a little" per interpreter.  No bleeding or LOF.  Provided reassurance regarding movement; will obtain growth ultrasound in one week to assess growth due to history of SGA.  Urine results not available until pt discharged; moderate leuk > urine culture.

## 2012-11-22 NOTE — Progress Notes (Signed)
P= 83 Pt. States the she has felt the baby move less for the last two weeks; states baby moves more at night. C/o of lower pelvic pain.

## 2012-11-23 LAB — CULTURE, OB URINE: Colony Count: NO GROWTH

## 2012-11-29 ENCOUNTER — Ambulatory Visit (HOSPITAL_COMMUNITY)
Admission: RE | Admit: 2012-11-29 | Discharge: 2012-11-29 | Disposition: A | Discharge status: Home / Self Care | Payer: No Typology Code available for payment source | Source: Ambulatory Visit | Attending: Family | Admitting: Family

## 2012-11-29 ENCOUNTER — Other Ambulatory Visit: Payer: Self-pay | Admitting: Family

## 2012-11-29 DIAGNOSIS — O0992 Supervision of high risk pregnancy, unspecified, second trimester: Secondary | ICD-10-CM

## 2012-11-29 DIAGNOSIS — Z3689 Encounter for other specified antenatal screening: Secondary | ICD-10-CM | POA: Insufficient documentation

## 2012-11-29 DIAGNOSIS — O09299 Supervision of pregnancy with other poor reproductive or obstetric history, unspecified trimester: Secondary | ICD-10-CM | POA: Insufficient documentation

## 2012-12-13 ENCOUNTER — Ambulatory Visit (INDEPENDENT_AMBULATORY_CARE_PROVIDER_SITE_OTHER): Payer: No Typology Code available for payment source | Admitting: Family

## 2012-12-13 VITALS — BP 108/76 | Temp 97.9°F | Wt 173.6 lb

## 2012-12-13 DIAGNOSIS — O09299 Supervision of pregnancy with other poor reproductive or obstetric history, unspecified trimester: Secondary | ICD-10-CM

## 2012-12-13 LAB — POCT URINALYSIS DIP (DEVICE)
Bilirubin Urine: NEGATIVE
Glucose, UA: NEGATIVE mg/dL
Ketones, ur: NEGATIVE mg/dL
Nitrite: NEGATIVE
Protein, ur: NEGATIVE mg/dL
Specific Gravity, Urine: 1.01 (ref 1.005–1.030)
Urobilinogen, UA: 0.2 mg/dL (ref 0.0–1.0)
pH: 6.5 (ref 5.0–8.0)

## 2012-12-13 NOTE — Progress Notes (Signed)
Pulse- 86 Patient reports a little more fetal movement than last time she was here but still doesn't feel much

## 2012-12-13 NOTE — Progress Notes (Signed)
U/S scheduled 01/01/13 at 915 am.

## 2012-12-13 NOTE — Progress Notes (Signed)
Doing well; reviewed last ultrasound results.  Due to history of SGA and mother's concerns regarding movement of fetus will schedule another growth [place], if normal will schedule if indicated.

## 2012-12-18 ENCOUNTER — Other Ambulatory Visit: Payer: Self-pay | Admitting: Family

## 2012-12-18 MED ORDER — NITROFURANTOIN MONOHYD MACRO 100 MG PO CAPS: 100.0000 mg | ORAL_CAPSULE | Freq: Two times a day (BID) | ORAL | Status: DC

## 2012-12-26 ENCOUNTER — Other Ambulatory Visit: Payer: Self-pay | Admitting: Family

## 2012-12-26 NOTE — Progress Notes (Signed)
Pt notified via interpreter Kandis Cocking) that she had a UTI and that the RX was sent to the indicated pharmacy.  No additional questions or concerns.

## 2013-01-01 ENCOUNTER — Ambulatory Visit (HOSPITAL_COMMUNITY)
Admission: RE | Admit: 2013-01-01 | Discharge: 2013-01-01 | Disposition: A | Discharge status: Home / Self Care | Payer: No Typology Code available for payment source | Source: Ambulatory Visit | Attending: Family | Admitting: Family

## 2013-01-01 ENCOUNTER — Ambulatory Visit (HOSPITAL_COMMUNITY): Admission: RE | Admit: 2013-01-01 | Payer: No Typology Code available for payment source | Source: Ambulatory Visit

## 2013-01-01 DIAGNOSIS — O09299 Supervision of pregnancy with other poor reproductive or obstetric history, unspecified trimester: Secondary | ICD-10-CM | POA: Insufficient documentation

## 2013-01-01 DIAGNOSIS — Z3689 Encounter for other specified antenatal screening: Secondary | ICD-10-CM | POA: Insufficient documentation

## 2013-01-03 ENCOUNTER — Ambulatory Visit (INDEPENDENT_AMBULATORY_CARE_PROVIDER_SITE_OTHER): Payer: No Typology Code available for payment source | Admitting: Obstetrics & Gynecology

## 2013-01-03 VITALS — BP 125/80 | Temp 97.0°F | Wt 178.3 lb

## 2013-01-03 DIAGNOSIS — O0992 Supervision of high risk pregnancy, unspecified, second trimester: Secondary | ICD-10-CM

## 2013-01-03 DIAGNOSIS — O09899 Supervision of other high risk pregnancies, unspecified trimester: Secondary | ICD-10-CM

## 2013-01-03 DIAGNOSIS — Z23 Encounter for immunization: Secondary | ICD-10-CM

## 2013-01-03 LAB — POCT URINALYSIS DIP (DEVICE)
Bilirubin Urine: NEGATIVE
Glucose, UA: NEGATIVE mg/dL
Nitrite: NEGATIVE
Protein, ur: 30 mg/dL — AB
Specific Gravity, Urine: >=1.03 (ref 1.005–1.030)
Urobilinogen, UA: 0.2 mg/dL (ref 0.0–1.0)
pH: 6 (ref 5.0–8.0)

## 2013-01-03 LAB — CBC
HCT: 34.6 % — ABNORMAL LOW (ref 36.0–46.0)
Hemoglobin: 12.1 g/dL (ref 12.0–15.0)
MCH: 31.3 pg (ref 26.0–34.0)
MCHC: 35 g/dL (ref 30.0–36.0)
RBC: 3.87 MIL/uL (ref 3.87–5.11)
RDW: 14 % (ref 11.5–15.5)

## 2013-01-03 LAB — OB RESULTS CONSOLE RPR: RPR: NONREACTIVE

## 2013-01-03 MED ORDER — TETANUS-DIPHTH-ACELL PERTUSSIS 5-2.5-18.5 LF-MCG/0.5 IM SUSP: 0.5000 mL | Freq: Once | INTRAMUSCULAR | Status: DC

## 2013-01-03 NOTE — Addendum Note (Signed)
Addended by: Louanna Raw on: 01/03/2013 11:24 AM   Modules accepted: Orders

## 2013-01-03 NOTE — Progress Notes (Signed)
Growth on 12/4 is 54%.  Pt needs flu shot and tdap today.  Pt has not been able to give urine yet.   GCT adn 28 week slabs today. F/U US in early January

## 2013-01-03 NOTE — Addendum Note (Signed)
Addended by: Sherre Lain A on: 01/03/2013 11:05 AM   Modules accepted: Orders

## 2013-01-03 NOTE — Progress Notes (Signed)
U/S scheduled 02/01/13 at 815 am.

## 2013-01-03 NOTE — Progress Notes (Signed)
P=112 28 week labs and 1 hour gtt

## 2013-01-04 LAB — HIV ANTIBODY (ROUTINE TESTING W REFLEX): HIV: NONREACTIVE

## 2013-01-04 LAB — RPR

## 2013-01-07 ENCOUNTER — Encounter: Payer: Self-pay | Admitting: Obstetrics & Gynecology

## 2013-01-17 ENCOUNTER — Encounter: Payer: Self-pay | Admitting: Family Medicine

## 2013-01-17 ENCOUNTER — Ambulatory Visit (INDEPENDENT_AMBULATORY_CARE_PROVIDER_SITE_OTHER): Payer: No Typology Code available for payment source | Admitting: Family Medicine

## 2013-01-17 VITALS — BP 127/79 | Temp 96.9°F | Wt 180.8 lb

## 2013-01-17 DIAGNOSIS — O09899 Supervision of other high risk pregnancies, unspecified trimester: Secondary | ICD-10-CM

## 2013-01-17 DIAGNOSIS — O0992 Supervision of high risk pregnancy, unspecified, second trimester: Secondary | ICD-10-CM

## 2013-01-17 LAB — POCT URINALYSIS DIP (DEVICE)
Bilirubin Urine: NEGATIVE
Glucose, UA: NEGATIVE mg/dL
Ketones, ur: NEGATIVE mg/dL
Nitrite: NEGATIVE

## 2013-01-17 NOTE — Progress Notes (Signed)
P - 97 

## 2013-01-17 NOTE — Progress Notes (Signed)
+  FM. No lof, no vb no ctx  Reviewed growth scan - repeat 2Jan 54%tile  Tracie [name] is a 27 y.o. G2P1001 at [redacted]w[redacted]d by here for ROB visit.  Discussed with Patient:  -Plans to breast feed.  All questions answered. -Continue prenatal vitamins. -  Reviewed genetics screen  -Reviewed fetal kick counts (Pt to perform daily at a time when the baby is active, lie laterally with both hands on belly in quiet room and count all movements (hiccups, shoulder rolls, obvious kicks, etc); pt is to report to clinic or MAU for less than 10 movements felt in a one hour time period-pt told as soon as she counts 10 movements the count is complete.)  - Routine precautions discussed (depression, infection s/s).   Patient provided with all pertinent phone numbers for emergencies. - RTC for any VB, regular, painful cramps/ctxs occurring at a rate of >2/10 min, fever (100.5 or higher), n/v/d, any pain that is unresolving or worsening, LOF, decreased fetal movement, CP, SOB, edema  Problems: Patient Active Problem List   Diagnosis Date Noted  . Supervision of other high-risk pregnancy(V23.89) 10/29/2012  . Supervision of high risk pregnancy in second trimester 10/25/2012    To Do: 1.  [ ]  Vaccines: Flu: 12/4 Tdap: 12/4 [ ]  BCM:  POP/OCP  Edu: [x ] PTL precautions; [ ]  BF class; [ ]  childbirth class; [ ]   BF counseling;

## 2013-01-17 NOTE — Patient Instructions (Signed)
Tercer trimestre del embarazo  (Third Trimester of Pregnancy) El tercer trimestre del embarazo abarca desde la semana 29 hasta la semana 42, desde el 7 mes hasta el 9. En este trimestre el feto se desarrolla muy rpidamente. Hacia el final del noveno mes, el beb que an no ha nacido mide alrededor de 20 pulgadas (45 cm) de largo y pesa entre 6 y 10 libras (2,700 y 4,500 kg).  CAMBIOS CORPORALES  Su organismo atravesar numerosos cambios durante el embarazo. Los cambios varan de una mujer a otra.   Seguir aumentando de peso. Es esperable que aumente entre 25 y 35 libras (11 16 kg) hacia el final del embarazo.  Podrn aparecer las primeras estras en las caderas, abdomen y mamas.  Tendr necesidad de orinar con ms frecuencia porque el feto baja hacia la pelvis y presiona en la vejiga.  Como consecuencia del embarazo, podr sentir acidez estomacal continuamente.  Podr estar constipada ya que ciertas hormonas hacen que los msculos que hacen progresar los desechos a travs de los intestinos trabajen ms lentamente.  Pueden aparecer hemorroides o abultarse e hincharse las venas (venas varicosas).  Podr sentir dolor plvico debido al aumento de peso ya que las hormonas del embarazo relajan las articulaciones entre los huesos de la pelvis. El dolor de espalda puede ser consecuencia de la exigencia de los msculos que soportan la postura.  Sus mamas seguirn desarrollndose y estarn ms sensibles. A veces sale una secrecin amarilla de las mamas, que se llama calostro.  El ombligo puede salir hacia afuera.  Podr sentir que le falta el aire debido a que se expande el tero.  Podr notar que el feto "baja" o que se siente ms bajo en el abdomen.  Podr tener una prdida de secrecin mucosa con sangre. Esto suele ocurrir entre unos pocos das y una semana antes del parto.  El cuello se vuelve delgado y blando (se borra) cerca de la fecha de parto. QU DEBE ESPERAR EN LAS CONSULTAS  PRENATALES  Le harn exmenes prenatales cada 2 semanas hasta la semana 36. A partir de ese momento le harn exmenes semanales. Durante una visita prenatal de rutina:   La pesarn para verificar que usted y el feto se encuentran dentro de los lmites normales.  Le tomarn la presin arterial.  Le medirn el abdomen para verificar el desarrollo del beb.  Escucharn los latidos fetales.  Se evaluarn los resultados de los estudios solicitados en visitas anteriores.  Le controlarn el cuello del tero cuando est prxima la fecha de parto para ver si se ha borrado. Alrededor de la semana 36 el mdico controlar el cuello del tero. Al mismo tiempo realizar un anlisis de las secreciones del tejido vaginal. Este examen es para determinar si hay un tipo de bacteria, estreptococo Grupo B. El mdico le explicar esto con ms detalle.  El mdico podr preguntarle:   Como le gustara que fuera el parto.  Cmo se siente.  Si siente los movimientos del beb.  Si tiene sntomas anormales, como prdida de lquido, sangrado, dolores de cabeza intenso o clicos abdominales.  Si tiene alguna duda. Otros estudios que podrn realizarse durante el tercer trimestre son:   Anlisis de sangre para controlar sus niveles de hierro (anemia).  Controles fetales para determinar su salud, el nivel de actividad y su desarrollo. Si tiene alguna enfermedad o si tuvo problemas durante el embarazo, le harn estudios. FALSO TRABAJO DE PARTO  Es posible que sienta contracciones pequeas e irregulares que finalmente   desaparecen. Se llaman contracciones de Braxton Hicks o falso trabajo de parto. Las contracciones pueden durar horas, das o an semanas antes de que el verdadero trabajo de parto se inicie. Si las contracciones tienen intervalos regulares, se intensifican o se hacen dolorosas, lo mejor es que la revise su mdico.  SIGNOS DE TRABAJO DE PARTO   Espasmos del tipo menstrual.  Contracciones cada 5  minutos o menos.  Contracciones que comienzan en la parte superior del tero y se expanden hacia abajo, a la zona inferior del abdomen y la espalda.  Sensacin de presin que aumenta en la pelvis o dolor en la espalda.  Aparece una secrecin acuosa o sanguinolenta por la vagina. Si tiene alguno de estos signos antes de la semana 37 del embarazo, llame a su mdico inmediatamente. Debe concurrir al hospital para ser controlada inmediatamente.  INSTRUCCIONES PARA EL CUIDADO EN EL HOGAR   Evite fumar, consumir hierbas, beber alcohol y utilizar frmacos que no le hayan recetado. Estas sustancias qumicas afectan la formacin y el desarrollo del beb.  Siga las indicaciones del profesional con respecto a como tomar los medicamentos. Durante el embarazo, hay medicamentos que son seguros y otros no lo son.  Realice actividad fsica slo segn las indicaciones del mdico. Sentir clicos uterinos es el mejor signo para detener la actividad fsica.  Contine haciendo comidas regulares y sanas.  Use un sostn que le brinde buen soporte si sus mamas estn sensibles.  No utilice la baera con agua caliente, baos turcos o saunas.  Colquese el cinturn de seguridad cuando conduzca.  Evite comer carne cruda queso sin cocinar y el contacto con los utensilios y desperdicios de los gatos. Estos elementos contienen grmenes que pueden causar defectos de nacimiento en el beb.  Tome las vitaminas indicadas para la etapa prenatal.  Pruebe un laxante (si el mdico la autoriza) si tiene constipacin. Consuma ms alimentos ricos en fibra, como vegetales y frutas frescos y cereales enteros. Beba gran cantidad de lquido para mantener la orina de tono claro o amarillo plido.  Tome baos de agua tibia para calmar el dolor o las molestias causadas por las hemorroides. Use una crema para las hemorroides si el mdico la autoriza.  Si tiene venas varicosas, use medias de soporte. Eleve los pies durante 15 minutos,  3 4 veces por da. Limite el consumo de sal en su dieta.  Evite levantar objetos pesados, use zapatos de tacones bajos y mantenga una buena postura.  Descanse con las piernas elevadas si tiene calambres o dolor de cintura.  Visite a su dentista si no lo ha hecho durante el embarazo. Use un cepillo de dientes blando para higienizarse los dientes y use suavemente el hilo dental.  Puede continuar su vida sexual excepto que el mdico le indique otra cosa.  No haga viajes largos excepto que sea absolutamente necesario y slo con la aprobacin de su mdico.  Tome clases prenatales para entender, practicar y hacer preguntas sobre el trabajo de parto y el alumbramiento.  Haga un ensayo sobre la partida al hospital.  Prepare el bolso que llevar al hospital.  Prepare la habitacin del beb.  Contine concurriendo a todas las visitas prenatales segn las indicaciones de su mdico. SOLICITE ATENCIN MDICA SI:   No est segura si est en trabajo de parto o ha roto la bolsa de aguas.  Tiene mareos.  Siente clicos leves, presin en la pelvis o dolor persistente en el abdomen.  Tiene nuseas o vmitos persistentes.  Observa una   secrecin vaginal con mal olor.  Siente dolor al orinar. SOLICITE ATENCIN MDICA DE INMEDIATO SI:   Tiene fiebre.  Pierde lquido o sangre por la vagina.  Tiene sangrado o pequeas prdidas vaginales.  Siente dolor intenso o clicos en el abdomen.  Sube o baja de peso rpidamente.  Le falta el aire y le duele el pecho al respirar.  Sbitamente se le hincha el rostro, las manos, los tobillos, los pies o las piernas de manera extrema.  No ha sentido los movimientos del beb durante una hora.  Siente un dolor de cabeza intenso que no se alivia con medicamentos.  Su visin se modifica. Document Released: 10/27/2004 Document Revised: 09/19/2012 [employer] Patient Information 2014 [employer].  

## 2013-01-31 NOTE — L&D Delivery Note (Addendum)
Delivery Note At 4:11 AM a healthy female was delivered via Vaginal, Spontaneous Delivery (Presentation: ; Occiput Anterior).  APGAR: 9, 9; weight .   Placenta status: Intact, Spontaneous.  Cord: 3 vessels with the following complications: None.  Cord pH: obtained and sent  Anesthesia: None  Episiotomy: None Lacerations: 2nd degree;Perineal Suture Repair: 3.0 monocryl Est. Blood Loss (300 mL):   Good maternal effort with pushing to deliver a healthy baby girl. Baby delivered with nuchal cord x 1 which was reduced without difficulty. Baby was placed on maternal abdomen for skin to skin care. Delayed cord clamping performed and cut by [name]. Placenta delivered intact with 3V cord. 2nd degree perineal laceration was repaired and was hemostatic at completion. Mom to postpartum and baby to skin to skin.   Mom to postpartum.  Baby to Couplet care / Skin to Skin.  [name], James 04/04/2013, 4:27 AM  I spoke with and examined patient and agree with resident's note and plan of care.  Tawana ScaleMichael Ryan Ashia Dehner, MD OB Fellow 04/04/2013 5:41 AM

## 2013-02-01 ENCOUNTER — Ambulatory Visit (HOSPITAL_COMMUNITY)
Admission: RE | Admit: 2013-02-01 | Discharge: 2013-02-01 | Disposition: A | Discharge status: Home / Self Care | Payer: No Typology Code available for payment source | Source: Ambulatory Visit | Attending: Obstetrics & Gynecology | Admitting: Obstetrics & Gynecology

## 2013-02-01 DIAGNOSIS — O09899 Supervision of other high risk pregnancies, unspecified trimester: Secondary | ICD-10-CM

## 2013-02-01 DIAGNOSIS — O365990 Maternal care for other known or suspected poor fetal growth, unspecified trimester, not applicable or unspecified: Secondary | ICD-10-CM | POA: Insufficient documentation

## 2013-02-01 DIAGNOSIS — O0992 Supervision of high risk pregnancy, unspecified, second trimester: Secondary | ICD-10-CM

## 2013-02-01 DIAGNOSIS — O09299 Supervision of pregnancy with other poor reproductive or obstetric history, unspecified trimester: Secondary | ICD-10-CM | POA: Insufficient documentation

## 2013-02-07 ENCOUNTER — Ambulatory Visit (INDEPENDENT_AMBULATORY_CARE_PROVIDER_SITE_OTHER): Payer: No Typology Code available for payment source | Admitting: Family

## 2013-02-07 VITALS — BP 124/84 | Temp 98.0°F | Wt 186.4 lb

## 2013-02-07 DIAGNOSIS — O09899 Supervision of other high risk pregnancies, unspecified trimester: Secondary | ICD-10-CM

## 2013-02-07 LAB — POCT URINALYSIS DIP (DEVICE)
Bilirubin Urine: NEGATIVE
GLUCOSE, UA: 100 mg/dL — AB
Ketones, ur: NEGATIVE mg/dL
NITRITE: NEGATIVE
PROTEIN: NEGATIVE mg/dL
Specific Gravity, Urine: 1.025 (ref 1.005–1.030)
UROBILINOGEN UA: 0.2 mg/dL (ref 0.0–1.0)
pH: 6 (ref 5.0–8.0)

## 2013-02-07 LAB — GLUCOSE, CAPILLARY: Glucose-Capillary: 61 mg/dL — ABNORMAL LOW (ref 70–99)

## 2013-02-07 NOTE — Progress Notes (Signed)
Reports sharp stabbing pain in groin when walking or getting up from sitting. No bleeding or leaking of fluid.   Reviewed growth ultrasound on 02/02/12, growth 62%ile.  2+glucose in urine > RBS 61 ; no headaches, vision changes, or epigastric pain.

## 2013-02-07 NOTE — Progress Notes (Signed)
P= 77 Edema in feet and hands  C/o of lower abdominal/pelvic/vaginal pressure.

## 2013-02-21 ENCOUNTER — Ambulatory Visit (INDEPENDENT_AMBULATORY_CARE_PROVIDER_SITE_OTHER): Payer: No Typology Code available for payment source | Admitting: Family

## 2013-02-21 VITALS — BP 126/80 | Temp 97.9°F | Wt 184.7 lb

## 2013-02-21 DIAGNOSIS — O09899 Supervision of other high risk pregnancies, unspecified trimester: Secondary | ICD-10-CM

## 2013-02-21 LAB — POCT URINALYSIS DIP (DEVICE)
Bilirubin Urine: NEGATIVE
GLUCOSE, UA: NEGATIVE mg/dL
Ketones, ur: NEGATIVE mg/dL
NITRITE: NEGATIVE
PH: 7 (ref 5.0–8.0)
PROTEIN: NEGATIVE mg/dL
Specific Gravity, Urine: 1.015 (ref 1.005–1.030)
UROBILINOGEN UA: 0.2 mg/dL (ref 0.0–1.0)

## 2013-02-21 NOTE — Progress Notes (Signed)
P= 93.  Used Interpreter.

## 2013-02-21 NOTE — Progress Notes (Signed)
No questions or concerns.  Reviewed GBS and GC/CT for next visit.

## 2013-03-07 ENCOUNTER — Ambulatory Visit (INDEPENDENT_AMBULATORY_CARE_PROVIDER_SITE_OTHER): Payer: No Typology Code available for payment source | Admitting: Obstetrics & Gynecology

## 2013-03-07 VITALS — BP 138/85 | Temp 98.8°F | Wt 189.2 lb

## 2013-03-07 DIAGNOSIS — O09899 Supervision of other high risk pregnancies, unspecified trimester: Secondary | ICD-10-CM

## 2013-03-07 LAB — POCT URINALYSIS DIP (DEVICE)
BILIRUBIN URINE: NEGATIVE
Glucose, UA: NEGATIVE mg/dL
Ketones, ur: NEGATIVE mg/dL
NITRITE: NEGATIVE
Protein, ur: NEGATIVE mg/dL
SPECIFIC GRAVITY, URINE: 1.025 (ref 1.005–1.030)
UROBILINOGEN UA: 0.2 mg/dL (ref 0.0–1.0)
pH: 6.5 (ref 5.0–8.0)

## 2013-03-07 LAB — OB RESULTS CONSOLE GC/CHLAMYDIA
CHLAMYDIA, DNA PROBE: NEGATIVE
Chlamydia: NEGATIVE
Gonorrhea: NEGATIVE
Gonorrhea: NEGATIVE

## 2013-03-07 LAB — OB RESULTS CONSOLE GBS: STREP GROUP B AG: NEGATIVE

## 2013-03-07 NOTE — Progress Notes (Signed)
Pulse- 96  Edema-hands/feet  Pressure/pain-lower abd

## 2013-03-07 NOTE — Progress Notes (Signed)
Last growth was 62%.  Fundal height on track.  Cultures today.  Reveiwed signs of labor

## 2013-03-08 LAB — GC/CHLAMYDIA PROBE AMP
CT PROBE, AMP APTIMA: NEGATIVE
GC Probe RNA: NEGATIVE

## 2013-03-09 LAB — CULTURE, BETA STREP (GROUP B ONLY)

## 2013-03-12 ENCOUNTER — Encounter: Payer: Self-pay | Admitting: Obstetrics & Gynecology

## 2013-03-14 ENCOUNTER — Ambulatory Visit (INDEPENDENT_AMBULATORY_CARE_PROVIDER_SITE_OTHER): Payer: No Typology Code available for payment source | Admitting: Obstetrics and Gynecology

## 2013-03-14 ENCOUNTER — Encounter: Payer: Self-pay | Admitting: Obstetrics and Gynecology

## 2013-03-14 VITALS — BP 122/90 | Temp 97.4°F | Wt 190.2 lb

## 2013-03-14 DIAGNOSIS — O09899 Supervision of other high risk pregnancies, unspecified trimester: Secondary | ICD-10-CM

## 2013-03-14 DIAGNOSIS — O0992 Supervision of high risk pregnancy, unspecified, second trimester: Secondary | ICD-10-CM

## 2013-03-14 LAB — POCT URINALYSIS DIP (DEVICE)
BILIRUBIN URINE: NEGATIVE
GLUCOSE, UA: NEGATIVE mg/dL
Ketones, ur: NEGATIVE mg/dL
NITRITE: NEGATIVE
Protein, ur: NEGATIVE mg/dL
Specific Gravity, Urine: 1.02 (ref 1.005–1.030)
Urobilinogen, UA: 0.2 mg/dL (ref 0.0–1.0)
pH: 6.5 (ref 5.0–8.0)

## 2013-03-14 NOTE — Progress Notes (Signed)
P=88 

## 2013-03-14 NOTE — Progress Notes (Signed)
Patient is doing well without complaints. FM/labor precautions reviewed 

## 2013-03-21 ENCOUNTER — Encounter: Payer: Self-pay | Admitting: Family Medicine

## 2013-03-21 ENCOUNTER — Ambulatory Visit (INDEPENDENT_AMBULATORY_CARE_PROVIDER_SITE_OTHER): Payer: No Typology Code available for payment source | Admitting: Family Medicine

## 2013-03-21 VITALS — BP 124/86 | Wt 193.2 lb

## 2013-03-21 DIAGNOSIS — O09899 Supervision of other high risk pregnancies, unspecified trimester: Secondary | ICD-10-CM

## 2013-03-21 DIAGNOSIS — O9989 Other specified diseases and conditions complicating pregnancy, childbirth and the puerperium: Secondary | ICD-10-CM

## 2013-03-21 DIAGNOSIS — O0992 Supervision of high risk pregnancy, unspecified, second trimester: Secondary | ICD-10-CM

## 2013-03-21 DIAGNOSIS — O365990 Maternal care for other known or suspected poor fetal growth, unspecified trimester, not applicable or unspecified: Secondary | ICD-10-CM

## 2013-03-21 DIAGNOSIS — R8271 Bacteriuria: Secondary | ICD-10-CM

## 2013-03-21 LAB — POCT URINALYSIS DIP (DEVICE)
BILIRUBIN URINE: NEGATIVE
GLUCOSE, UA: NEGATIVE mg/dL
Ketones, ur: NEGATIVE mg/dL
Nitrite: NEGATIVE
PROTEIN: NEGATIVE mg/dL
Specific Gravity, Urine: 1.015 (ref 1.005–1.030)
Urobilinogen, UA: 0.2 mg/dL (ref 0.0–1.0)
pH: 7 (ref 5.0–8.0)

## 2013-03-21 NOTE — Progress Notes (Signed)
Pulse- 91 Patient reports pelvic pain/pressure

## 2013-03-21 NOTE — Patient Instructions (Signed)
Tercer trimestre del embarazo  (Third Trimester of Pregnancy) El tercer trimestre del embarazo abarca desde la semana 29 hasta la semana 42, desde el 7 mes hasta el 9. En este trimestre el feto se desarrolla muy rpidamente. Hacia el final del noveno mes, el beb que an no ha nacido mide alrededor de 20 pulgadas (45 cm) de largo y pesa entre 6 y 10 libras (2,700 y 4,500 kg).  CAMBIOS CORPORALES  Su organismo atravesar numerosos cambios durante el embarazo. Los cambios varan de una mujer a otra.   Seguir aumentando de peso. Es esperable que aumente entre 25 y 35 libras (11 16 kg) hacia el final del embarazo.  Podrn aparecer las primeras estras en las caderas, abdomen y mamas.  Tendr necesidad de orinar con ms frecuencia porque el feto baja hacia la pelvis y presiona en la vejiga.  Como consecuencia del embarazo, podr sentir acidez estomacal continuamente.  Podr estar constipada ya que ciertas hormonas hacen que los msculos que hacen progresar los desechos a travs de los intestinos trabajen ms lentamente.  Pueden aparecer hemorroides o abultarse e hincharse las venas (venas varicosas).  Podr sentir dolor plvico debido al aumento de peso ya que las hormonas del embarazo relajan las articulaciones entre los huesos de la pelvis. El dolor de espalda puede ser consecuencia de la exigencia de los msculos que soportan la postura.  Sus mamas seguirn desarrollndose y estarn ms sensibles. A veces sale una secrecin amarilla de las mamas, que se llama calostro.  El ombligo puede salir hacia afuera.  Podr sentir que le falta el aire debido a que se expande el tero.  Podr notar que el feto "baja" o que se siente ms bajo en el abdomen.  Podr tener una prdida de secrecin mucosa con sangre. Esto suele ocurrir entre unos pocos das y una semana antes del parto.  El cuello se vuelve delgado y blando (se borra) cerca de la fecha de parto. QU DEBE ESPERAR EN LAS CONSULTAS  PRENATALES  Le harn exmenes prenatales cada 2 semanas hasta la semana 36. A partir de ese momento le harn exmenes semanales. Durante una visita prenatal de rutina:   La pesarn para verificar que usted y el feto se encuentran dentro de los lmites normales.  Le tomarn la presin arterial.  Le medirn el abdomen para verificar el desarrollo del beb.  Escucharn los latidos fetales.  Se evaluarn los resultados de los estudios solicitados en visitas anteriores.  Le controlarn el cuello del tero cuando est prxima la fecha de parto para ver si se ha borrado. Alrededor de la semana 36 el mdico controlar el cuello del tero. Al mismo tiempo realizar un anlisis de las secreciones del tejido vaginal. Este examen es para determinar si hay un tipo de bacteria, estreptococo Grupo B. El mdico le explicar esto con ms detalle.  El mdico podr preguntarle:   Como le gustara que fuera el parto.  Cmo se siente.  Si siente los movimientos del beb.  Si tiene sntomas anormales, como prdida de lquido, sangrado, dolores de cabeza intenso o clicos abdominales.  Si tiene alguna duda. Otros estudios que podrn realizarse durante el tercer trimestre son:   Anlisis de sangre para controlar sus niveles de hierro (anemia).  Controles fetales para determinar su salud, el nivel de actividad y su desarrollo. Si tiene alguna enfermedad o si tuvo problemas durante el embarazo, le harn estudios. FALSO TRABAJO DE PARTO  Es posible que sienta contracciones pequeas e irregulares que finalmente   desaparecen. Se llaman contracciones de Braxton Hicks o falso trabajo de parto. Las contracciones pueden durar horas, das o an semanas antes de que el verdadero trabajo de parto se inicie. Si las contracciones tienen intervalos regulares, se intensifican o se hacen dolorosas, lo mejor es que la revise su mdico.  SIGNOS DE TRABAJO DE PARTO   Espasmos del tipo menstrual.  Contracciones cada 5  minutos o menos.  Contracciones que comienzan en la parte superior del tero y se expanden hacia abajo, a la zona inferior del abdomen y la espalda.  Sensacin de presin que aumenta en la pelvis o dolor en la espalda.  Aparece una secrecin acuosa o sanguinolenta por la vagina. Si tiene alguno de estos signos antes de la semana 37 del embarazo, llame a su mdico inmediatamente. Debe concurrir al hospital para ser controlada inmediatamente.  INSTRUCCIONES PARA EL CUIDADO EN EL HOGAR   Evite fumar, consumir hierbas, beber alcohol y utilizar frmacos que no le hayan recetado. Estas sustancias qumicas afectan la formacin y el desarrollo del beb.  Siga las indicaciones del profesional con respecto a como tomar los medicamentos. Durante el embarazo, hay medicamentos que son seguros y otros no lo son.  Realice actividad fsica slo segn las indicaciones del mdico. Sentir clicos uterinos es el mejor signo para detener la actividad fsica.  Contine haciendo comidas regulares y sanas.  Use un sostn que le brinde buen soporte si sus mamas estn sensibles.  No utilice la baera con agua caliente, baos turcos o saunas.  Colquese el cinturn de seguridad cuando conduzca.  Evite comer carne cruda queso sin cocinar y el contacto con los utensilios y desperdicios de los gatos. Estos elementos contienen grmenes que pueden causar defectos de nacimiento en el beb.  Tome las vitaminas indicadas para la etapa prenatal.  Pruebe un laxante (si el mdico la autoriza) si tiene constipacin. Consuma ms alimentos ricos en fibra, como vegetales y frutas frescos y cereales enteros. Beba gran cantidad de lquido para mantener la orina de tono claro o amarillo plido.  Tome baos de agua tibia para calmar el dolor o las molestias causadas por las hemorroides. Use una crema para las hemorroides si el mdico la autoriza.  Si tiene venas varicosas, use medias de soporte. Eleve los pies durante 15 minutos,  3 4 veces por da. Limite el consumo de sal en su dieta.  Evite levantar objetos pesados, use zapatos de tacones bajos y mantenga una buena postura.  Descanse con las piernas elevadas si tiene calambres o dolor de cintura.  Visite a su dentista si no lo ha hecho durante el embarazo. Use un cepillo de dientes blando para higienizarse los dientes y use suavemente el hilo dental.  Puede continuar su vida sexual excepto que el mdico le indique otra cosa.  No haga viajes largos excepto que sea absolutamente necesario y slo con la aprobacin de su mdico.  Tome clases prenatales para entender, practicar y hacer preguntas sobre el trabajo de parto y el alumbramiento.  Haga un ensayo sobre la partida al hospital.  Prepare el bolso que llevar al hospital.  Prepare la habitacin del beb.  Contine concurriendo a todas las visitas prenatales segn las indicaciones de su mdico. SOLICITE ATENCIN MDICA SI:   No est segura si est en trabajo de parto o ha roto la bolsa de aguas.  Tiene mareos.  Siente clicos leves, presin en la pelvis o dolor persistente en el abdomen.  Tiene nuseas o vmitos persistentes.  Observa una   secrecin vaginal con mal olor.  Siente dolor al orinar. SOLICITE ATENCIN MDICA DE INMEDIATO SI:   Tiene fiebre.  Pierde lquido o sangre por la vagina.  Tiene sangrado o pequeas prdidas vaginales.  Siente dolor intenso o clicos en el abdomen.  Sube o baja de peso rpidamente.  Le falta el aire y le duele el pecho al respirar.  Sbitamente se le hincha el rostro, las manos, los tobillos, los pies o las piernas de manera extrema.  No ha sentido los movimientos del beb durante una hora.  Siente un dolor de cabeza intenso que no se alivia con medicamentos.  Su visin se modifica. Document Released: 10/27/2004 Document Revised: 09/19/2012 [employer] Patient Information 2014 [employer].  

## 2013-03-21 NOTE — Progress Notes (Signed)
+  FM, no LOF, no VB, no CTX Desires membranes stripped. Unable to reach   [name] is a 28 y.o. G2P1001 at [id number] here for ROB visit.    Discussed with Patient:  - Plans to breast/bottle feed.  All questions answered. - Continue prenatal vitamins. - Reviewed fetal kick counts Pt to perform daily at a time when the baby is active, lie laterally with both hands on belly in quiet room and count all movements (hiccups, shoulder rolls, obvious kicks, etc); pt is to report to clinic MAU for less than 10 movements felt in a 2 hour time period-pt told as soon as she counts 10 movements the count is complete.  - Routine precautions discussed (depression, infection s/s).   Patient provided with all pertinent phone numbers for emergencies. - RTC for any VB, regular, painful cramps/ctxs occurring at a rate of >2/10 min, fever (100.5 or higher), n/v/d, any pain that is unresolving or worsening, LOF, decreased fetal movement, CP, SOB, edema -RTC in one week for next visit.  Problems: Patient Active Problem List   Diagnosis Date Noted  . Supervision of other high-risk pregnancy(V23.89) 10/29/2012  . Supervision of high risk pregnancy in second trimester 10/25/2012    To Do: 1.   [ ]  Vaccines:recd [ ]  BCM: OCP/POP [ ]  Readiness: baby has a place to sleep, car seat, other baby necessities.  Edu: [x ] TL precautions; [ ]  BF class; [ ]  childbirth class; [ ]   BF counseling;

## 2013-03-26 ENCOUNTER — Encounter: Payer: Self-pay | Admitting: *Deleted

## 2013-03-28 ENCOUNTER — Encounter: Payer: Self-pay | Admitting: Obstetrics & Gynecology

## 2013-04-01 ENCOUNTER — Encounter (HOSPITAL_COMMUNITY): Payer: Self-pay | Admitting: General Practice

## 2013-04-01 ENCOUNTER — Inpatient Hospital Stay (HOSPITAL_COMMUNITY)
Admission: AD | Admit: 2013-04-01 | Discharge: 2013-04-01 | Disposition: A | Discharge status: Home / Self Care | Payer: Medicaid Other | Source: Ambulatory Visit | Attending: Obstetrics & Gynecology | Admitting: Obstetrics & Gynecology

## 2013-04-01 ENCOUNTER — Inpatient Hospital Stay (HOSPITAL_COMMUNITY)
Admission: AD | Admit: 2013-04-01 | Discharge: 2013-04-01 | Disposition: A | Discharge status: Home / Self Care | Payer: Self-pay | Source: Ambulatory Visit | Attending: Obstetrics & Gynecology | Admitting: Obstetrics & Gynecology

## 2013-04-01 ENCOUNTER — Encounter: Payer: Self-pay | Admitting: Obstetrics and Gynecology

## 2013-04-01 ENCOUNTER — Telehealth: Payer: Self-pay | Admitting: *Deleted

## 2013-04-01 ENCOUNTER — Encounter (HOSPITAL_COMMUNITY): Payer: Self-pay | Admitting: *Deleted

## 2013-04-01 DIAGNOSIS — O26859 Spotting complicating pregnancy, unspecified trimester: Secondary | ICD-10-CM | POA: Insufficient documentation

## 2013-04-01 DIAGNOSIS — O365990 Maternal care for other known or suspected poor fetal growth, unspecified trimester, not applicable or unspecified: Secondary | ICD-10-CM

## 2013-04-01 DIAGNOSIS — O479 False labor, unspecified: Secondary | ICD-10-CM | POA: Insufficient documentation

## 2013-04-01 DIAGNOSIS — O0992 Supervision of high risk pregnancy, unspecified, second trimester: Secondary | ICD-10-CM

## 2013-04-01 DIAGNOSIS — O09899 Supervision of other high risk pregnancies, unspecified trimester: Secondary | ICD-10-CM | POA: Insufficient documentation

## 2013-04-01 NOTE — Telephone Encounter (Signed)
Called patient to find out why she had signed in at the kiosk this morning and then went upstairs to MAU. She informed me that she spoke to an interpreter in the lobby and told her that she had been experiencing some contractions and was having some bleeding. The interpreter then told her to go upstairs to MAU because that's what they would do once she came back to a room anyway and that she would let the staff know. I informed her that if anything like this were to happen again she should come and let the front office staff know. I have rescheduled her appointment for 04/08/2013 at 0845.

## 2013-04-01 NOTE — MAU Provider Note (Signed)
Attestation of Attending Supervision of Advanced Practitioner (CNM/NP): Evaluation and management procedures were performed by the Advanced Practitioner under my supervision and collaboration.  I have reviewed the Advanced Practitioner's note and chart, and I agree with the management and plan.  HARRAWAY-SMITH, Sayid Moll 6:51 PM

## 2013-04-01 NOTE — MAU Note (Signed)
C/o ucs since 0700 this AM; c/o ?SROM @1500  this afternoon;

## 2013-04-01 NOTE — Discharge Instructions (Signed)
Fetal Movement Counts Patient Name: __________________________________________________ Patient Due Date: ____________________ Performing a fetal movement count is highly recommended in high-risk pregnancies, but it is good for every pregnant woman to do. Your caregiver may ask you to start counting fetal movements at 28 weeks of the pregnancy. Fetal movements often increase:  After eating a full meal.  After physical activity.  After eating or drinking something sweet or cold.  At rest. Pay attention to when you feel the baby is most active. This will help you notice a pattern of your baby's sleep and wake cycles and what factors contribute to an increase in fetal movement. It is important to perform a fetal movement count at the same time each day when your baby is normally most active.  HOW TO COUNT FETAL MOVEMENTS 1. Find a quiet and comfortable area to sit or lie down on your left side. Lying on your left side provides the best blood and oxygen circulation to your baby. 2. Write down the day and time on a sheet of paper or in a journal. 3. Start counting kicks, flutters, swishes, rolls, or jabs in a 2 hour period. You should feel at least 10 movements within 2 hours. 4. If you do not feel 10 movements in 2 hours, wait 2 3 hours and count again. Look for a change in the pattern or not enough counts in 2 hours. SEEK MEDICAL CARE IF:  You feel less than 10 counts in 2 hours, tried twice.  There is no movement in over an hour.  The pattern is changing or taking longer each day to reach 10 counts in 2 hours.  You feel the baby is not moving as he or she usually does. Date: ____________ Movements: ____________ Start time: ____________ Finish time: ____________  Date: ____________ Movements: ____________ Start time: ____________ Finish time: ____________ Date: ____________ Movements: ____________ Start time: ____________ Finish time: ____________ Date: ____________ Movements: ____________  Start time: ____________ Finish time: ____________ Date: ____________ Movements: ____________ Start time: ____________ Finish time: ____________ Date: ____________ Movements: ____________ Start time: ____________ Finish time: ____________ Date: ____________ Movements: ____________ Start time: ____________ Finish time: ____________ Date: ____________ Movements: ____________ Start time: ____________ Finish time: ____________  Date: ____________ Movements: ____________ Start time: ____________ Finish time: ____________ Date: ____________ Movements: ____________ Start time: ____________ Finish time: ____________ Date: ____________ Movements: ____________ Start time: ____________ Finish time: ____________ Date: ____________ Movements: ____________ Start time: ____________ Finish time: ____________ Date: ____________ Movements: ____________ Start time: ____________ Finish time: ____________ Date: ____________ Movements: ____________ Start time: ____________ Finish time: ____________ Date: ____________ Movements: ____________ Start time: ____________ Finish time: ____________  Date: ____________ Movements: ____________ Start time: ____________ Finish time: ____________ Date: ____________ Movements: ____________ Start time: ____________ Finish time: ____________ Date: ____________ Movements: ____________ Start time: ____________ Finish time: ____________ Date: ____________ Movements: ____________ Start time: ____________ Finish time: ____________ Date: ____________ Movements: ____________ Start time: ____________ Finish time: ____________ Date: ____________ Movements: ____________ Start time: ____________ Finish time: ____________ Date: ____________ Movements: ____________ Start time: ____________ Finish time: ____________  Date: ____________ Movements: ____________ Start time: ____________ Finish time: ____________ Date: ____________ Movements: ____________ Start time: ____________ Finish time:  ____________ Date: ____________ Movements: ____________ Start time: ____________ Finish time: ____________ Date: ____________ Movements: ____________ Start time: ____________ Finish time: ____________ Date: ____________ Movements: ____________ Start time: ____________ Finish time: ____________ Date: ____________ Movements: ____________ Start time: ____________ Finish time: ____________ Date: ____________ Movements: ____________ Start time: ____________ Finish time: ____________  Date: ____________ Movements: ____________ Start time: ____________ Finish   time: ____________ Date: ____________ Movements: ____________ Start time: ____________ Finish time: ____________ Date: ____________ Movements: ____________ Start time: ____________ Finish time: ____________ Date: ____________ Movements: ____________ Start time: ____________ Finish time: ____________ Date: ____________ Movements: ____________ Start time: ____________ Finish time: ____________ Date: ____________ Movements: ____________ Start time: ____________ Finish time: ____________ Date: ____________ Movements: ____________ Start time: ____________ Finish time: ____________  Date: ____________ Movements: ____________ Start time: ____________ Finish time: ____________ Date: ____________ Movements: ____________ Start time: ____________ Finish time: ____________ Date: ____________ Movements: ____________ Start time: ____________ Finish time: ____________ Date: ____________ Movements: ____________ Start time: ____________ Finish time: ____________ Date: ____________ Movements: ____________ Start time: ____________ Finish time: ____________ Date: ____________ Movements: ____________ Start time: ____________ Finish time: ____________ Date: ____________ Movements: ____________ Start time: ____________ Finish time: ____________  Date: ____________ Movements: ____________ Start time: ____________ Finish time: ____________ Date: ____________ Movements:  ____________ Start time: ____________ Finish time: ____________ Date: ____________ Movements: ____________ Start time: ____________ Finish time: ____________ Date: ____________ Movements: ____________ Start time: ____________ Finish time: ____________ Date: ____________ Movements: ____________ Start time: ____________ Finish time: ____________ Date: ____________ Movements: ____________ Start time: ____________ Finish time: ____________ Date: ____________ Movements: ____________ Start time: ____________ Finish time: ____________  Date: ____________ Movements: ____________ Start time: ____________ Finish time: ____________ Date: ____________ Movements: ____________ Start time: ____________ Finish time: ____________ Date: ____________ Movements: ____________ Start time: ____________ Finish time: ____________ Date: ____________ Movements: ____________ Start time: ____________ Finish time: ____________ Date: ____________ Movements: ____________ Start time: ____________ Finish time: ____________ Date: ____________ Movements: ____________ Start time: ____________ Finish time: ____________ Document Released: 02/16/2006 Document Revised: 01/04/2012 Document Reviewed: 11/14/2011 [employer] Patient Information 2014 [employer].  

## 2013-04-01 NOTE — MAU Note (Signed)
Patient state she is having contractions every 15-30 minutes with a little spotting but more this am that was brown. Denies leaking. Reports good fetal movement.

## 2013-04-01 NOTE — MAU Provider Note (Signed)
Chief Complaint:  Labor Eval   First Provider Initiated Contact with Patient 04/01/13 1737      HPI: Tracie [name] is a 28 y.o. G2P1001 at [redacted]w[redacted]d who presents to maternity admissions reporting continued irregular UCs and leaking small amount clear fluid at 1500. Seen here this am for RN labor check.   Good fetal movement.   Pregnancy Course: HRC due to prior SGA. [place] 03/04/13: 62nd%ile and normal AFV.   Past Medical History: Past Medical History  Diagnosis Date  . Medical history non-contributory     Past obstetric history: OB History  Gravida Para Term Preterm AB SAB TAB Ectopic Multiple Living  2 1 1       1     # Outcome Date GA Lbr Len/2nd Weight Sex Delivery Anes PTL Lv  2 CUR           1 TRM 10/03/04 [redacted]w[redacted]d  2.495 kg (5 lb 8 oz) F SVD  N Y     Comments: SGA      Past Surgical History: Past Surgical History  Procedure Laterality Date  . No past surgeries       Family History: Family History  Problem Relation Age of Onset  . Diabetes Other   . Diabetes Mother   . Diabetes Paternal Aunt   . Diabetes Paternal Grandmother     Social History: History  Substance Use Topics  . Smoking status: Never Smoker   . Smokeless tobacco: Never Used  . Alcohol Use: No    Allergies:  Allergies  Allergen Reactions  . Pork-Derived Products Hives    Meds:  Prescriptions prior to admission  Medication Sig Dispense Refill  . calcium carbonate (TUMS - DOSED IN MG ELEMENTAL CALCIUM) 500 MG chewable tablet Chew 1 tablet by mouth as needed for indigestion or heartburn.      . Prenatal Vit-Fe Fumarate-FA (MULTIVITAMIN-PRENATAL) 27-0.8 MG TABS tablet Take 1 tablet by mouth daily at 12 noon.        ROS: Pertinent findings in history of present illness.  Physical Exam  Blood pressure 127/82, pulse 107, resp. rate 18. GENERAL: Well-developed, well-nourished female in no acute distress.  HEENT: normocephalic HEART: normal rate RESP: normal effort ABDOMEN: Soft, non-tender, gravid  appropriate for gestational age EXTREMITIES: Nontender, no edema NEURO: alert and oriented SPECULUM EXAM: NEFG, light brown mucus discharge, scnat blood, cervix clean Dilation: 1 Effacement (%): 70 Cervical Position: Middle Station: -3 Presentation: Vertex Exam by:: Morrison Old RN  FHT:  Baseline 120 , moderate variability, accelerations present, no decelerations Contractions: irregular, occasional   Labs: Fern - negative  Imaging:  No results found.  MAU Course: Cervix unchanged after > 1 hour Irregular occasional contractions only. Patient is comfortable.  Membranes intact  Assessment: 1. Poor fetal growth, affecting management of mother, antepartum condition or complication   2. Supervision of high risk pregnancy in second trimester   Membranes intact  Plan: Discharge home Labor precautions and fetal kick counts Patient given appointment for follow-up in clinic tomorrow  Patient may return to MAU as needed or if her condition were to change worsen    Medication List    ASK your doctor about these medications       calcium carbonate 500 MG chewable tablet  Commonly known as:  TUMS - dosed in mg elemental calcium  Chew 1 tablet by mouth as needed for indigestion or heartburn.     multivitamin-prenatal 27-0.8 MG Tabs tablet  Take 1 tablet by mouth daily at  12 noon.        Danae OrleansDeirdre C Poe, CNM 04/01/2013 5:39 PM  1750 - Care assumed from Deirdre Christy GentlesPoe, CNM  Freddi StarrJulie N Ethier, PA-C 04/01/2013 6:07 PM

## 2013-04-01 NOTE — Discharge Instructions (Signed)
Contracciones de Braxton Hicks  (Braxton Hicks Contractions)  Usted presenta un falso trabajo de parto. Durante todo el embarazo aparecen con frecuencia contracciones del útero. Hacia el final del embarazo (32-34 semanas) estas contracciones (Braxton Hicks) pueden hacerse más fuertes. No se trata de un trabajo de parto verdadero porque no producen un agrandamiento (dilatación) y afinamiento del cuello del útero. Algunas veces resulta difícil distinguirlas del trabajo de parto verdadero porque en algunos casos llegan a ser muy intensas y las personas tienen distinta tolerancia al dolor. No debe sentirse avergonzada si ingresa al hospital con un falso trabajo de parto. En ocasiones la única forma de saber si está en un parto verdadero es observar los cambios en el cuello del útero. A veces, la única forma de saber si realmente está en trabajo de parto es para el médico observar los cambios en el útero.  Como diferenciar el trabajo de parto falso del verdadero:  · Trabajos de parto falso.  · Las contracciones falsas generalmente duran menos y no son tan intensas como las verdaderas.  · Generalmente se sienten en la zona inferior del abdomen y en la ingle.  · Pueden aliviarse con una caminata o cambiar de posición mientras se está acostada.  · A medida que pasa el tiempo son más cortas y débiles.  · Generalmente son irregulares.  · No se hacen progresivamente más intensas y cercanas entre sí como las verdaderas.  · Trabajo de parto verdadero.  · Las contracciones verdaderas duran de 30 a 70 segundos, son más regulares, generalmente se hacen más intensas y aumentan en frecuencia.  · No desaparecen al caminar.  · La molestia generalmente se siente en la parte superior del útero y se extiende hacia la zona inferior del abdomen y hacia la cintura.  · El profesional que la asiste podrá examinarla para determinar si el trabajo de parto es verdadero. El examen mostrará si el cuello del útero se está dilatando y afinando.  Si  no hay problemas prenatales u otras complicaciones de la salud asociadas al embarazo, no habrá inconvenientes si la envían a su casa y espera el comienzo del verdadero trabajo de parto.  INSTRUCCIONES PARA EL CUIDADO DOMICILIARIO  · Siga con los ejercicios y las indicaciones habituales.  · Tome los medicamentos como se le indicó.  · Cumpla con las citas regularmente.  · Coma y beba ligero si cree que dará a luz.  · Si se siente incómoda por las contracciones:  · Cambie de actividad, si está acostada o en reposo, camine y si está caminando, repose.  · Siéntense y repose en una bañadera con agua caliente.  · Beba entre 2 y 3 vasos de agua. La deshidratación puede causar contracciones BH.  · Respire lenta y profundamente varias veces por hora.  SOLICITE ATENCIÓN MÉDICA DE INMEDIATO SI:  · Las contracciones se intensifican, se hacen más regulares y cercanas entre sí.  · Tiene una pérdida importante de líquido de la vagina  · La temperatura oral se eleva sin motivo por encima de 38,9° C (102° F) o según le indique el profesional que la asiste.  · Elimina una mucosidad sanguinolenta.  · Presenta hemorragia vaginal.  · Presenta dolor abdominal constante.  · Siente un dolor en la parte baja de la espalda que nunca había sentido antes.  · Siente que el bebé empuja hacia abajo y le causa presión pélvica.  · El bebé no se mueve tanto como antes.  Document Released: 10/27/2004 Document Revised: 04/11/2011    Exit[employer]® Patient Information ©2014 [employer].

## 2013-04-02 ENCOUNTER — Ambulatory Visit (INDEPENDENT_AMBULATORY_CARE_PROVIDER_SITE_OTHER): Payer: Self-pay | Admitting: *Deleted

## 2013-04-02 DIAGNOSIS — O480 Post-term pregnancy: Secondary | ICD-10-CM

## 2013-04-02 NOTE — Progress Notes (Signed)
Pt presented for NST appt. Chart was reviewed and revealed that pt had 2 evaluations @ [facility] yesterday for r/o labor. Each time pt had reactive FHR and no decels with irregular UC's.  I explained to pt and her husband with assistance from Language Resources interpreter that fetal testing is not needed today. Plan was made for NST/AFI @ 1600 on 3/5.  Pt desires IOL @ 41 wks - will schedule and inform pt @ time of appt on 3/5.  Labor sx reviewed.  Pt and husband voiced understanding of plan of care and all information given.

## 2013-04-03 ENCOUNTER — Inpatient Hospital Stay (HOSPITAL_COMMUNITY)
Admission: AD | Admit: 2013-04-03 | Discharge: 2013-04-05 | DRG: 775 | Disposition: A | Discharge status: Home / Self Care | Payer: Medicaid Other | Source: Ambulatory Visit | Attending: Obstetrics and Gynecology | Admitting: Obstetrics and Gynecology

## 2013-04-03 ENCOUNTER — Encounter (HOSPITAL_COMMUNITY): Payer: Self-pay | Admitting: *Deleted

## 2013-04-03 ENCOUNTER — Telehealth (HOSPITAL_COMMUNITY): Payer: Self-pay | Admitting: *Deleted

## 2013-04-03 DIAGNOSIS — IMO0001 Reserved for inherently not codable concepts without codable children: Secondary | ICD-10-CM

## 2013-04-03 MED ORDER — OXYTOCIN BOLUS FROM INFUSION: 500.0000 mL | INTRAVENOUS | Status: DC

## 2013-04-03 MED ORDER — LIDOCAINE HCL (PF) 1 % IJ SOLN
30.0000 mL | INTRAMUSCULAR | Status: DC | PRN
  Administered 2013-04-04: 30 mL via SUBCUTANEOUS
  Filled 2013-04-03: qty 30

## 2013-04-03 MED ORDER — IBUPROFEN 600 MG PO TABS
600.0000 mg | ORAL_TABLET | Freq: Four times a day (QID) | ORAL | Status: DC | PRN
  Administered 2013-04-04: 600 mg via ORAL
  Filled 2013-04-03: qty 1

## 2013-04-03 MED ORDER — OXYCODONE-ACETAMINOPHEN 5-325 MG PO TABS: 1.0000 | ORAL_TABLET | ORAL | Status: DC | PRN

## 2013-04-03 MED ORDER — LACTATED RINGERS IV SOLN
INTRAVENOUS | Status: DC
  Administered 2013-04-03: via INTRAVENOUS

## 2013-04-03 MED ORDER — CITRIC ACID-SODIUM CITRATE 334-500 MG/5ML PO SOLN: 30.0000 mL | ORAL | Status: DC | PRN

## 2013-04-03 MED ORDER — ACETAMINOPHEN 325 MG PO TABS: 650.0000 mg | ORAL_TABLET | ORAL | Status: DC | PRN

## 2013-04-03 MED ORDER — LACTATED RINGERS IV SOLN: 500.0000 mL | INTRAVENOUS | Status: DC | PRN

## 2013-04-03 MED ORDER — OXYTOCIN 40 UNITS IN LACTATED RINGERS INFUSION - SIMPLE MED
62.5000 mL/h | INTRAVENOUS | Status: DC
  Administered 2013-04-04: 62.5 mL/h via INTRAVENOUS
  Filled 2013-04-03: qty 1000

## 2013-04-03 MED ORDER — ONDANSETRON HCL 4 MG/2ML IJ SOLN: 4.0000 mg | Freq: Four times a day (QID) | INTRAMUSCULAR | Status: DC | PRN

## 2013-04-03 NOTE — Telephone Encounter (Signed)
Preadmission screen Interpreter number [phone]

## 2013-04-03 NOTE — [facility] Note (Signed)
Pt has been having contractions since Monday. Contractions were every 20min, then every 10 and not they are closer.

## 2013-04-03 NOTE — MAU Note (Signed)
Pt G2 P1 at 40.3wks having contractions every with bloody show.

## 2013-04-03 NOTE — H&P (Signed)
Tracie [name] is a 28 y.o. female G2P1001 with IUP at 7233w3d by US presenting for active labor. Pt states she has been having regular, every 3-5 minutes contractions, associated with none vaginal bleeding.  Membranes are intact, with active fetal movement.    [facility]  Prenatal History/Complications: None  Past Medical History: Past Medical History  Diagnosis Date  . Medical history non-contributory     Past Surgical History: Past Surgical History  Procedure Laterality Date  . No past surgeries      Obstetrical History: OB History   Grav Para Term Preterm Abortions TAB SAB Ect Mult Living   2 1 1       1      Social History: History   Social History  . Marital Status: Single    Spouse Name: N/A    Number of Children: N/A  . Years of Education: N/A   Social History Main Topics  . Smoking status: Never Smoker   . Smokeless tobacco: Never Used  . Alcohol Use: No  . Drug Use: No  . Sexual Activity: Yes    Birth Control/ Protection: None   Other Topics Concern  . None   Social History Narrative  . None    Family History: Family History  Problem Relation Age of Onset  . Diabetes Other   . Diabetes Mother   . Diabetes Paternal Aunt   . Diabetes Paternal Grandmother     Allergies: Allergies  Allergen Reactions  . Pork-Derived Products Hives    Prescriptions prior to admission  Medication Sig Dispense Refill  . calcium carbonate (TUMS - DOSED IN MG ELEMENTAL CALCIUM) 500 MG chewable tablet Chew 1 tablet by mouth as needed for indigestion or heartburn.      . Prenatal Vit-Fe Fumarate-FA (MULTIVITAMIN-PRENATAL) 27-0.8 MG TABS tablet Take 1 tablet by mouth daily at 12 noon.        Review of Systems: Negative unless otherwise stated in History above  Physicial Blood pressure 139/85, pulse 105, temperature 98.2 F (36.8 C), temperature source Oral, resp. rate 20, height 5\' 1"  (1.549 m), weight 88.905 kg (196 lb). General  appearance: alert, cooperative and mild distress Lungs: clear to auscultation bilaterally Heart: regular rate and rhythm Abdomen: soft, non-tender; bowel sounds normal, gravid Extremities: Homans sign is negative, no sign of DVT Presentation: cephalic Fetal monitoringBaseline: 140 bpm, Variability: Good {> 6 bpm), Accelerations: Reactive and Decelerations: Absent Uterine activityDate/time of onset: 3/4 and Frequency: Every 3-5 minutes Dilation: 3 Effacement (%): 80  Prenatal labs: ABO, Rh: --/--/O POS (07/05 2021) Antibody:   Rubella:   RPR: NON REAC (12/04 1106)  HBsAg:    HIV: NON REACTIVE (12/04 1106)  GBS: Negative (02/05 0000)  GTT: early 120; third trimester 108  Prenatal Transfer Tool  Maternal Diabetes: No Genetic Screening: Normal Maternal Ultrasounds/Referrals: Normal Fetal Ultrasounds or other Referrals:  None Maternal Substance Abuse:  No Significant Maternal Medications:  None Significant Maternal Lab Results: Lab values include: Group B Strep negative  No results found for this or any previous visit (from the past 24 hour(s)).  Assessment: Tracie [name] is a 28 y.o. G2P1001 at 3733w3d by here for active labor  #Labor:progressing normally, expectant management #Pain: Labor support, desires natural delivery #FWB: Cat 1 #ID:  GBS negative #Feeding: breast and bottle #MOC:unknown #Circ:  NA - female  Wenda LowJames Joyner MD [facility] PGY-1 04/03/2013, 11:26 PM  I spoke with and examined patient and agree with  resident's note and plan of care.  Tawana Scale, MD OB Fellow 04/04/2013 5:42 AM

## 2013-04-03 NOTE — Progress Notes (Signed)
Pt states she has a little headache 

## 2013-04-04 ENCOUNTER — Encounter (HOSPITAL_COMMUNITY): Payer: Self-pay | Admitting: *Deleted

## 2013-04-04 ENCOUNTER — Other Ambulatory Visit: Payer: Self-pay

## 2013-04-04 LAB — CBC
HCT: 41.4 % (ref 36.0–46.0)
Hemoglobin: 14.7 g/dL (ref 12.0–15.0)
MCH: 32 pg (ref 26.0–34.0)
MCHC: 35.5 g/dL (ref 30.0–36.0)
MCV: 90 fL (ref 78.0–100.0)
Platelets: 143 10*3/uL — ABNORMAL LOW (ref 150–400)
RBC: 4.6 MIL/uL (ref 3.87–5.11)
RDW: 13.8 % (ref 11.5–15.5)
WBC: 14.7 10*3/uL — ABNORMAL HIGH (ref 4.0–10.5)

## 2013-04-04 LAB — TYPE AND SCREEN
ABO/RH(D): O POS
Antibody Screen: NEGATIVE

## 2013-04-04 LAB — HEPATITIS B SURFACE ANTIBODY,QUALITATIVE: Hep B S Ab: NEGATIVE

## 2013-04-04 LAB — RPR: RPR Ser Ql: NONREACTIVE

## 2013-04-04 MED ORDER — SENNOSIDES-DOCUSATE SODIUM 8.6-50 MG PO TABS
2.0000 | ORAL_TABLET | ORAL | Status: DC
  Administered 2013-04-04: 2 via ORAL
  Filled 2013-04-04: qty 2

## 2013-04-04 MED ORDER — OXYCODONE-ACETAMINOPHEN 5-325 MG PO TABS: 1.0000 | ORAL_TABLET | ORAL | Status: DC | PRN

## 2013-04-04 MED ORDER — DIBUCAINE 1 % RE OINT: 1.0000 "application " | TOPICAL_OINTMENT | RECTAL | Status: DC | PRN

## 2013-04-04 MED ORDER — IBUPROFEN 600 MG PO TABS
600.0000 mg | ORAL_TABLET | Freq: Four times a day (QID) | ORAL | Status: DC
  Administered 2013-04-04 – 2013-04-05 (×5): 600 mg via ORAL
  Filled 2013-04-04 (×5): qty 1

## 2013-04-04 MED ORDER — ZOLPIDEM TARTRATE 5 MG PO TABS: 5.0000 mg | ORAL_TABLET | Freq: Every evening | ORAL | Status: DC | PRN

## 2013-04-04 MED ORDER — DIPHENHYDRAMINE HCL 25 MG PO CAPS: 25.0000 mg | ORAL_CAPSULE | Freq: Four times a day (QID) | ORAL | Status: DC | PRN

## 2013-04-04 MED ORDER — FENTANYL CITRATE 0.05 MG/ML IJ SOLN
100.0000 ug | INTRAMUSCULAR | Status: DC | PRN
  Administered 2013-04-04: 100 ug via INTRAVENOUS
  Filled 2013-04-04: qty 2

## 2013-04-04 MED ORDER — TETANUS-DIPHTH-ACELL PERTUSSIS 5-2.5-18.5 LF-MCG/0.5 IM SUSP: 0.5000 mL | Freq: Once | INTRAMUSCULAR | Status: DC

## 2013-04-04 MED ORDER — BENZOCAINE-MENTHOL 20-0.5 % EX AERO
1.0000 "application " | INHALATION_SPRAY | CUTANEOUS | Status: DC | PRN
  Administered 2013-04-04: 1 via TOPICAL
  Filled 2013-04-04: qty 56

## 2013-04-04 MED ORDER — SIMETHICONE 80 MG PO CHEW: 80.0000 mg | CHEWABLE_TABLET | ORAL | Status: DC | PRN

## 2013-04-04 MED ORDER — LANOLIN HYDROUS EX OINT: TOPICAL_OINTMENT | CUTANEOUS | Status: DC | PRN

## 2013-04-04 MED ORDER — FENTANYL CITRATE 0.05 MG/ML IJ SOLN: 50.0000 ug | INTRAMUSCULAR | Status: DC | PRN

## 2013-04-04 MED ORDER — WITCH HAZEL-GLYCERIN EX PADS: 1.0000 "application " | MEDICATED_PAD | CUTANEOUS | Status: DC | PRN

## 2013-04-04 MED ORDER — ONDANSETRON HCL 4 MG PO TABS: 4.0000 mg | ORAL_TABLET | ORAL | Status: DC | PRN

## 2013-04-04 MED ORDER — ONDANSETRON HCL 4 MG/2ML IJ SOLN: 4.0000 mg | INTRAMUSCULAR | Status: DC | PRN

## 2013-04-04 MED ORDER — PRENATAL MULTIVITAMIN CH
1.0000 | ORAL_TABLET | Freq: Every day | ORAL | Status: DC
  Administered 2013-04-04 – 2013-04-05 (×2): 1 via ORAL
  Filled 2013-04-04 (×2): qty 1

## 2013-04-04 NOTE — Progress Notes (Signed)
Admission nutrition screen triggered. Patients chart reviewed and assessed  for nutritional risk. Patient is determined to be at low nutrition  risk.   Leoda Smithhart M.Ed. R.D. LDN Neonatal Nutrition Support Specialist Pager [phone]  

## 2013-04-04 NOTE — Progress Notes (Signed)
UR chart review completed.  

## 2013-04-04 NOTE — Progress Notes (Signed)
Tracie [name] is a 28 y.o. G2P1001 at 7069w4d admitted for active labor  Subjective: Contraction pressure increasing asking for pain medication  Objective: BP 142/90  Pulse 101  Temp(Src) 97.8 F (36.6 C) (Oral)  Resp 20  Ht 5\' 1"  (1.549 m)  Wt 88.905 kg (196 lb)  BMI 37.05 kg/m2    FHT:  FHR: 130 bpm, variability: moderate,  accelerations:  Present,  decelerations:  Absent UC:   irregular, every 3-5 minutes SVE:   Dilation: 6 Effacement (%): 80 Station: -2;-1 Exam by:: a. white rn  Labs: Lab Results  Component Value Date   WBC 14.7* 04/03/2013   HGB 14.7 04/03/2013   HCT 41.4 04/03/2013   MCV 90.0 04/03/2013   PLT 143* 04/03/2013    Assessment / Plan: Spontaneous labor, progressing normally  Labor: Progressing normally Fetal Wellbeing:  Category I Pain Control:  Fentanyl I/D:  gbs neg Anticipated MOD:  NSVD  Tracie [name], Tracie [name] 04/04/2013, 2:43 AM

## 2013-04-04 NOTE — Lactation Note (Signed)
This note was copied from the chart of Tracie Bertram Millarderesa Friebel. Lactation Consultation Note  Patient Name: Tracie [name] UXLKG'MToday's Date: 04/04/2013 Reason for consult: Initial assessment;Difficult latch as reported by RN who is starting to assist baby to latch with #20 NS and offering some formula via curved-tip syringe.  Mom speaks Spanish and both LC and RN are using interpreter to inform patient of breastfeeding techniques, use of NS and use and cleaning of DEBP.  LC encouraged review of Baby and Me pp 13-16 for review of BF information in [place].LC provided [facility] Resource brochure in Spanish, and reviewed WH services and list of community and web site resources. RN, Lupita LeashDonna has already assisted and demonstrated hand expression but states only scant signs of colostrum seen at this time.   Maternal Data Infant to breast within first hour of birth: Yes (initial LATCH score=7) Has patient been taught Hand Expression?: Yes (per RN, Lupita Leashonna) Does the patient have breastfeeding experience prior to this delivery?: No (Mom did not breastfeed with her older daughter, now 488 yo)  Feeding Feeding Type: Formula  LATCH Score/Interventions Latch: Grasps breast easily, tongue down, lips flanged, rhythmical sucking. Intervention(s): Assist with latch  Audible Swallowing: None (No colostrum observed through the shield after formula given) Intervention(s): Skin to skin  Type of Nipple: Flat Intervention(s): Double electric pump;Shells;Hand pump  Comfort (Breast/Nipple): Filling, red/small blisters or bruises, mild/mod discomfort  Problem noted: Mild/Moderate discomfort  Hold (Positioning): Assistance needed to correctly position infant at breast and maintain latch.  LATCH Score: 5  Lactation Tools Discussed/Used Tools: Nipple Shields Nipple shield size: 20 Initiated by:: RN, Lupita LeashDonna has provided DEBP and hand pump Date initiated:: 04/04/13   Consult Status Consult Status: Follow-up Date:  04/05/13 Follow-up type: In-patient    Tracie [name], [facility] [name] [facility] 04/04/2013, 6:10 PM

## 2013-04-05 ENCOUNTER — Ambulatory Visit: Payer: Self-pay

## 2013-04-05 MED ORDER — IBUPROFEN 600 MG PO TABS: 600.0000 mg | ORAL_TABLET | Freq: Four times a day (QID) | ORAL | Status: DC

## 2013-04-05 NOTE — Lactation Note (Signed)
This note was copied from the chart of Tracie Bertram Millarderesa Cullars. Lactation Consultation Note  Patient Name: Tracie [name] ZOXWR'UToday's Date: 04/05/2013 Reason for consult: Follow-up assessment;Difficult latch;Breast/nipple pain.  LC and RN, Corrie DandyMary arrived in room with interpreter.  Mom started by pumping (R) breast for 5 minutes and initiated flow, then was able to latch baby to (R) breast in football position using #20 NS and some drops of formula in tip of NS, then additional flow offered "at breast" during feeding as needed to enhance sucking bursts.  Mom reports initial nipple discomfort of 8 which decreased to 5 through feeding.  Swallows were noted even when no formula flow provided and signs of ebm in tip of NS.  Mom wants to re-latch baby after burping and baby able to latch briefly without additional formula.  LC also demonstrated how to finger-feed baby with curved-tip syringe.  Plan reviewed with mom via interpreter: Pre-pump on one breast, offer one breast per feeding for 20-30 minutes, using ebm or formula via syringe as needed to ensure baby sucks rhythmically. Alternate breasts at each feeding and apply comfort gelpads after feedings.  LC also asked interpreter to explain [facility] and need for a follow-up OP appt if NS used on day of discharge.   Maternal Data    Feeding Feeding Type: Breast Fed Length of feed: 25 min  LATCH Score/Interventions Latch: Grasps breast easily, tongue down, lips flanged, rhythmical sucking. (mom pre-pumped on (R) for 5 minutes with some febm flow starting) Intervention(s): Assist with latch;Breast compression  Audible Swallowing: Spontaneous and intermittent (some spontaneous swallows without flow of formula) Intervention(s): Skin to skin;Hand expression  Type of Nipple: Flat Intervention(s): Double electric pump  Comfort (Breast/Nipple): Filling, red/small blisters or bruises, mild/mod discomfort (nipple pain 8 then decreased to "5" as  rhythmical sucking bursts noted)  Problem noted: Severe discomfort  Hold (Positioning): Assistance needed to correctly position infant at breast and maintain latch. Intervention(s): Breastfeeding basics reviewed;Support Pillows  LATCH Score: 7 (observed by RN, LC and interpreter and feeding completed with LC at bedside)  Lactation Tools Discussed/Used Tools: Nipple Dorris CarnesShields;Other (comment) (curved-tip syringe for formula at breast) Nipple shield size: 20 WIC Program: Yes   Consult Status Consult Status: Follow-up Date: 04/06/13 Follow-up type: In-patient    Warrick ParisianBryant, Irean Kendricks [facility] 04/05/2013, 5:44 PM

## 2013-04-05 NOTE — Lactation Note (Signed)
This note was copied from the chart of Tracie [name]. Lactation Consultation Note [facility] [phone].  Mother recently breastfed baby with NS for 20 minutes and baby is sleeping on mother's chest. Mother states she is using the nipple shield due to cracked nipples.  Mother is prepumping and when milk starts to flow she puts baby to the breast. Maury DusDonna RN has been assisted mother.  Mother would like LC help with next feeding, will call.   Patient Name: Tracie Bertram Millarderesa Koelzer JWJXB'JToday's Date: 04/05/2013     Maternal Data    Feeding Feeding Type: Formula Length of feed: 20 min  LATCH Score/Interventions                      Lactation Tools Discussed/Used     Consult Status      Hardie PulleyBerkelhammer, Polina Burmaster Boschen 04/05/2013, 2:46 PM

## 2013-04-05 NOTE — Discharge Planning (Cosign Needed Addendum)
Obstetric Discharge Summary Reason for Admission: onset of labor Prenatal Procedures: ultrasound Intrapartum Procedures: spontaneous vaginal delivery Postpartum Procedures: none Complications-Operative and Postpartum: none Hemoglobin  Date Value Ref Range Status  04/03/2013 14.7  12.0 - 15.0 g/dL Final     HCT  Date Value Ref Range Status  04/03/2013 41.4  36.0 - 46.0 % Final    Discharge Diagnoses: Term Pregnancy-delivered  Hospital Course:  Tracie [name] is a 28 y.o. [id number] who presented with .  She had a uncomplicated SVD at 0411 on 04/04/13. She was able to ambulate, tolerate PO and void normally. She was cleared for discharge, and will go home pending the discharge status of her child.  Pt plans to use birth control pills as contraception and will be breastfeeding.  Delivery Note  At 4:11 AM a healthy female was delivered via Vaginal, Spontaneous Delivery (Presentation: ; Occiput Anterior). APGAR: 9, 9; weight .  Placenta status: Intact, Spontaneous. Cord: 3 vessels with the following complications: None. Cord pH: obtained and sent  Anesthesia: None  Episiotomy: None  Lacerations: 2nd degree;Perineal  Suture Repair: 3.0 monocryl  Est. Blood Loss (300 mL):  Good maternal effort with pushing to deliver a healthy baby girl. Baby delivered with nuchal cord x 1 which was reduced without difficulty. Baby was placed on maternal abdomen for skin to skin care. Delayed cord clamping performed and cut by [name]. Placenta delivered intact with 3V cord. 2nd degree perineal laceration was repaired and was hemostatic at completion. Mom to postpartum and baby to skin to skin.  Mom to postpartum. Baby to Couplet care / Skin to Skin.  Tracie [name], James  04/04/2013, 4:27 AM  I spoke with and examined patient and agree with resident's note and plan of care.  Tracie ScaleMichael Ryan Justis Closser, MD OB Fellow  04/04/2013  5:41 AM     Physical Exam:  General: alert and cooperative Lochia: appropriate Uterine Fundus: soft DVT  Evaluation: No evidence of DVT seen on physical exam.  Discharge Information: Date: 04/05/2013 Activity: pelvic rest Diet: routine Medications: Ibuprofen Baby feeding: plans to breastfeed Contraception: oral contraceptives (estrogen/progesterone) Condition: stable Instructions: refer to practice specific booklet Discharge to: home   Newborn Data: Live born female  Birth Weight: 7 lb 10.9 oz (3485 g) APGAR: 9, 9  Home with mother.  I spoke with and examined patient and agree with PA-S's note and plan of care.  Tracie ScaleMichael Ryan Tracie Amsler, MD Ob Fellow 04/05/2013 8:13 AM

## 2013-04-05 NOTE — Discharge Instructions (Signed)

## 2013-04-06 ENCOUNTER — Ambulatory Visit: Payer: Self-pay

## 2013-04-06 NOTE — Lactation Note (Signed)
This note was copied from the chart of Tracie [name]. Lactation Consultation Note  Patient Name: Tracie [name]: 04/06/2013 Reason for consult: Follow-up assessment Spanish interpreters - Tracie [name] and Tracie [name] present  Per mom - feeding with the nipple shield seems to keep her latched better than without. @ consult mom latched the baby  With the #20 Nipple shield and per mom comfortable. Baby noted be latched with depth and noted multiply swallows, increased with  Breast compressions. Prior to latch sized mom with the #24 NS , and felt  the #24 NS accomodated the base of the nipple better.  Discussed this with mom. Reviewed sore nipple and engorgement prevention and tx if needed. Mom already using the comfort gels and breast shells  Due to soreness and swollen areolas . Per mom the nipples feel a little better. LC noted both nipples to be pinky red with some positional strips. Encouraged   mom to use EBM , and olive oil with pumping. [facility] Monday for a DEBP.Has been pumping in the hospital 1st with  a hand pump and a DEBP. Shoed mom how the DEBP set up can be used manually if needed.  F/U LC apt for 3/17 at 9 am , Mom and dad aware , also have a reminder sheet and dad placed it in his phone .     Maternal Data    Feeding Feeding Type: Breast Fed Length of feed:  (still feeding at 10 mins with swallows )  LATCH Score/Interventions Latch: Grasps breast easily, tongue down, lips flanged, rhythmical sucking. Intervention(s): Adjust position;Assist with latch;Breast massage;Breast compression  Audible Swallowing: Spontaneous and intermittent  Type of Nipple: Everted at rest and after stimulation  Comfort (Breast/Nipple): Soft / non-tender     Hold (Positioning): No assistance needed to correctly position infant at breast. Intervention(s): Breastfeeding basics reviewed;Support Pillows;Position options;Skin to  skin  LATCH Score: 10  Lactation Tools Discussed/Used WIC Program: Yes   Consult Status Consult Status: Follow-up [name]: 04/16/13 (9 am ) Follow-up type: Out-patient    Tracie [name], Tracie [name] 04/06/2013, 12:09 PM

## 2013-04-07 ENCOUNTER — Inpatient Hospital Stay (HOSPITAL_COMMUNITY): Admission: RE | Admit: 2013-04-07 | Payer: Medicaid Other | Source: Ambulatory Visit

## 2013-04-08 ENCOUNTER — Encounter: Payer: Self-pay | Admitting: Obstetrics & Gynecology

## 2013-04-08 NOTE — H&P (Signed)
Attestation of Attending Supervision of Advanced Practitioner: Evaluation and management procedures were performed by the PA/NP/CNM/OB Fellow under my supervision/collaboration. Chart reviewed and agree with management and plan.  Kohana Amble V 04/08/2013 7:56 AM

## 2013-04-08 NOTE — Discharge Summary (Signed)
Obstetric Discharge Summary Reason for Admission: onset of labor Prenatal Procedures: none Intrapartum Procedures: spontaneous vaginal delivery Postpartum Procedures: none Complications-Operative and Postpartum: 2nd degree perineal laceration  Hospital Course: Admitted in active labor and progressed rapidly. Vaginal delivery as below. Met PP milestones. No acute issues. Breast feeding and will discuss contraception at follow up.  Delivery Note At 4:11 AM a healthy female was delivered via Vaginal, Spontaneous Delivery (Presentation: ; Occiput Anterior).  APGAR: 9, 9; weight .   Placenta status: Intact, Spontaneous.  Cord: 3 vessels with the following complications: None.  Cord pH: obtained and sent  Anesthesia: None  Episiotomy: None Lacerations: 2nd degree;Perineal Suture Repair: 3.0 monocryl Est. Blood Loss (300 mL):   Good maternal effort with pushing to deliver a healthy baby girl. Baby delivered with nuchal cord x 1 which was reduced without difficulty. Baby was placed on maternal abdomen for skin to skin care. Delayed cord clamping performed and cut by [name]. Placenta delivered intact with 3V cord. 2nd degree perineal laceration was repaired and was hemostatic at completion. Mom to postpartum and baby to skin to skin.   Mom to postpartum.  Baby to Couplet care / Skin to Skin.  Phill Myron 04/04/2013, 4:27 AM  I spoke with and examined patient and agree with resident's note and plan of care.  Fredrik Rigger, MD OB Fellow 04/04/2013 5:41 AM      H/H: Lab Results  Component Value Date/Time   HGB 14.7 04/03/2013 11:50 PM   HCT 41.4 04/03/2013 11:50 PM    Physical Exam: Abd: Appropriately tender, ND, Fundus _0  Incision: NA No c/c/e, Neg homan's sign, neg cords Lochia Appropriate  Discharge Diagnoses: Term Pregnancy-delivered  Discharge Information: Date: 08/12/2010 Activity: pelvic rest Diet: routine  Medications: PNV and Ibuprofen Breast feeding:  Yes Condition:  stable Instructions: refer to handout Discharge to: home    Future Appointments Provider Department Dept Phone   05/02/2013 1:45 PM Donnamae Jude, MD [facility] [phone]       Medication List         calcium carbonate 500 MG chewable tablet  Commonly known as:  TUMS - dosed in mg elemental calcium  Chew 1 tablet by mouth as needed for indigestion or heartburn.     ibuprofen 600 MG tablet  Commonly known as:  ADVIL,MOTRIN  Take 1 tablet (600 mg total) by mouth every 6 (six) hours.     multivitamin-prenatal 27-0.8 MG Tabs tablet  Take 1 tablet by mouth daily at 12 noon.           Follow-up Information   Follow up with [facility]. Schedule an appointment as soon as possible for a visit in 4 weeks.   Contact information:   [place] [id number] [phone]      Fredrik Rigger 04/08/2013,9:49 AM

## 2013-05-02 ENCOUNTER — Ambulatory Visit (INDEPENDENT_AMBULATORY_CARE_PROVIDER_SITE_OTHER): Payer: Self-pay | Admitting: Family Medicine

## 2013-05-02 ENCOUNTER — Encounter: Payer: Self-pay | Admitting: Family Medicine

## 2013-05-02 MED ORDER — NORETHINDRONE 0.35 MG PO TABS: 1.0000 | ORAL_TABLET | Freq: Every day | ORAL | Status: DC

## 2013-05-02 NOTE — Progress Notes (Signed)
  Subjective:     Tracie [name] is a 28 y.o. female who presents for a postpartum visit. She is 6 weeks postpartum following a spontaneous vaginal delivery. I have fully reviewed the prenatal and intrapartum course. The delivery was at 40 gestational weeks. Outcome: spontaneous vaginal delivery. Anesthesia: none. Postpartum course has been uneventful. Baby's course has been unremarkable. Baby is feeding by breast. Bleeding no bleeding. Bowel function is normal. Bladder function is normal. Patient is not sexually active. Contraception method is none. Postpartum depression screening: negative.  The following portions of the patient's history were reviewed and updated as appropriate: allergies, current medications, past family history, past medical history, past social history, past surgical history and problem list.  Review of Systems Pertinent items are noted in HPI.   Objective:    BP 126/89  Pulse 80  Temp(Src) 98.8 F (37.1 C) (Oral)  Ht 5\' 1"  (1.549 m)  Wt 176 lb 14.4 oz (80.241 kg)  BMI 33.44 kg/m2  Breastfeeding? Yes  General:  alert, cooperative and appears stated age       Abdomen--soft and non-tender Assessment:     Normal postpartum exam. Pap smear not done at today's visit.   Plan:    1. Contraception: oral progesterone-only contraceptive 2. Pap due 01/2014 3. Follow up in: 1 year or as needed.

## 2013-12-02 ENCOUNTER — Encounter: Payer: Self-pay | Admitting: Family Medicine

## 2014-03-11 IMAGING — US US OB DETAIL+14 WK
1 series · 12 of 28 positions shown · non-contrast
Comparison: none

[Series 1: us ob detail +14 wk · 98 acquisitions, 12 frames shown]
[im 4/98]
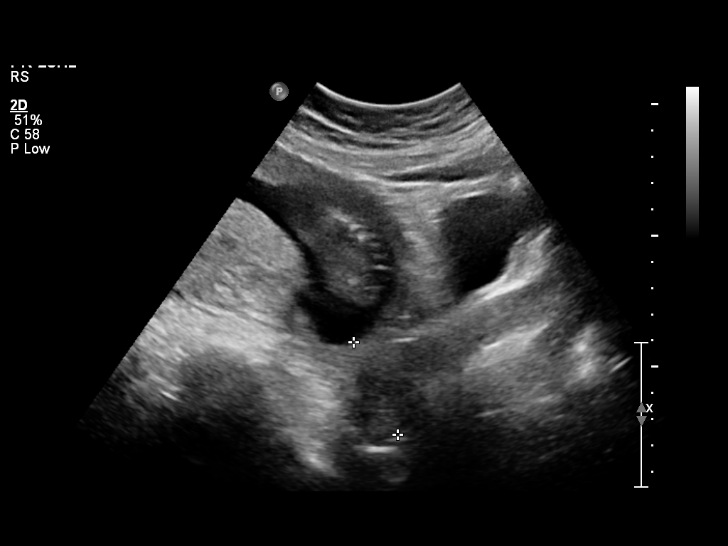
[im 11/98]
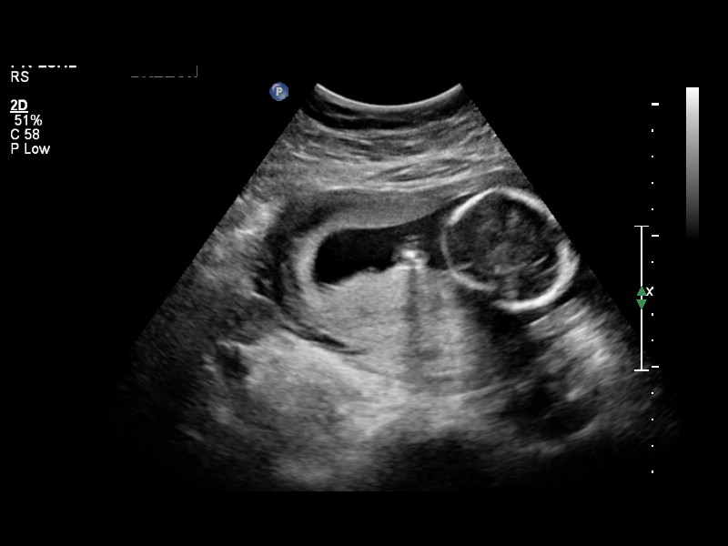
[im 18/98]
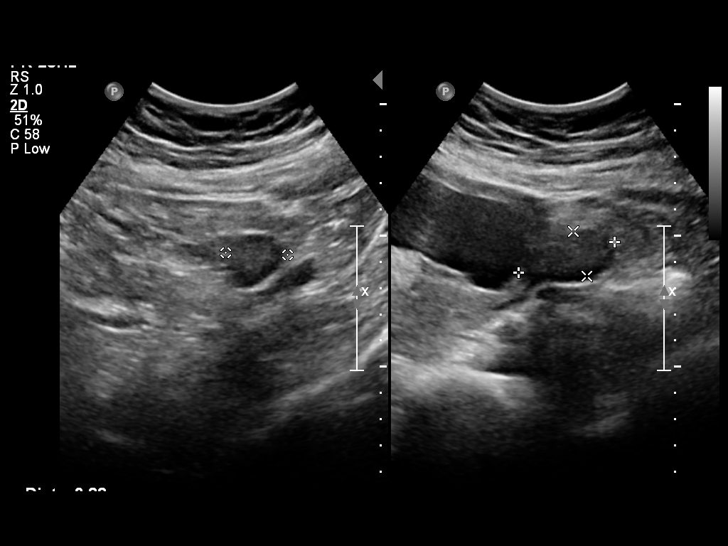
[im 29/98]
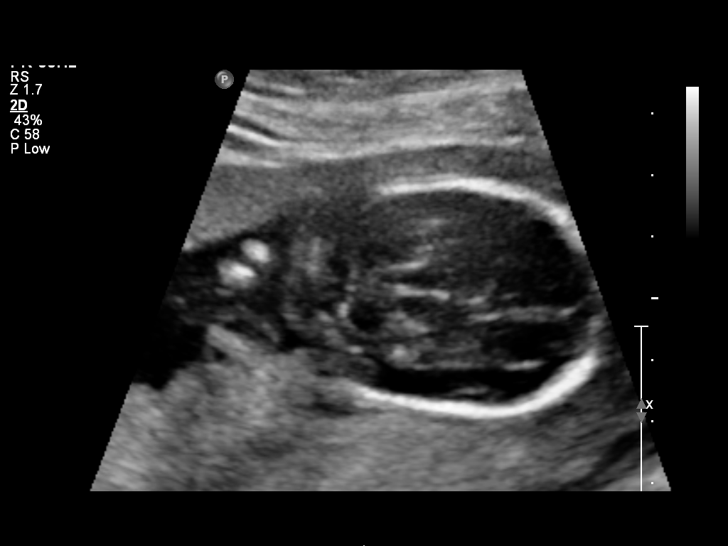
[im 36/98]
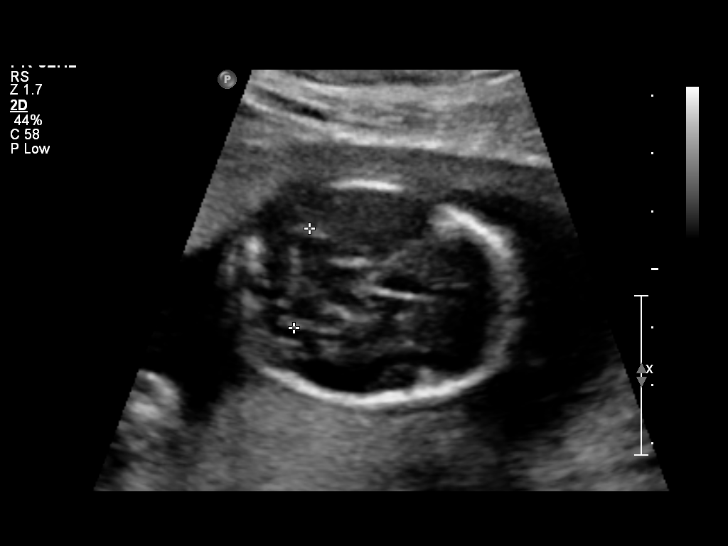
[im 44/98]
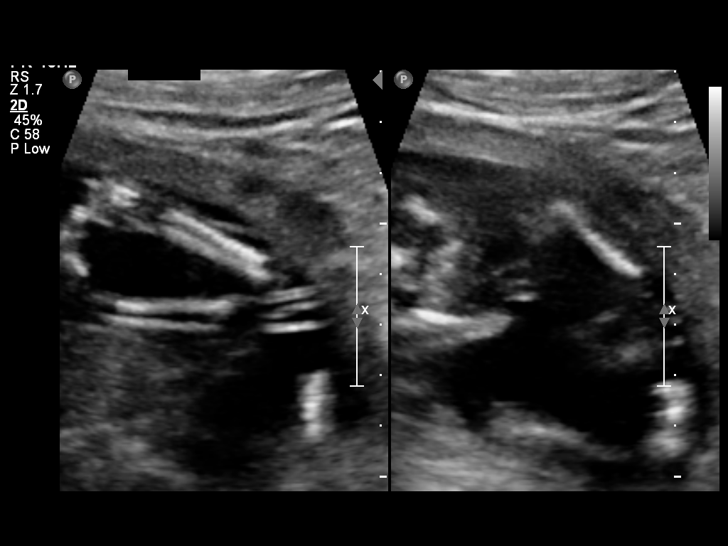
[im 54/98]
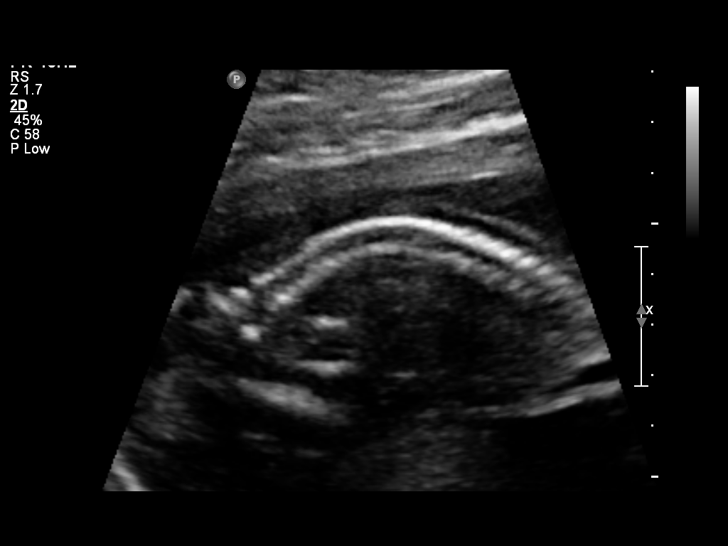
[im 62/98]
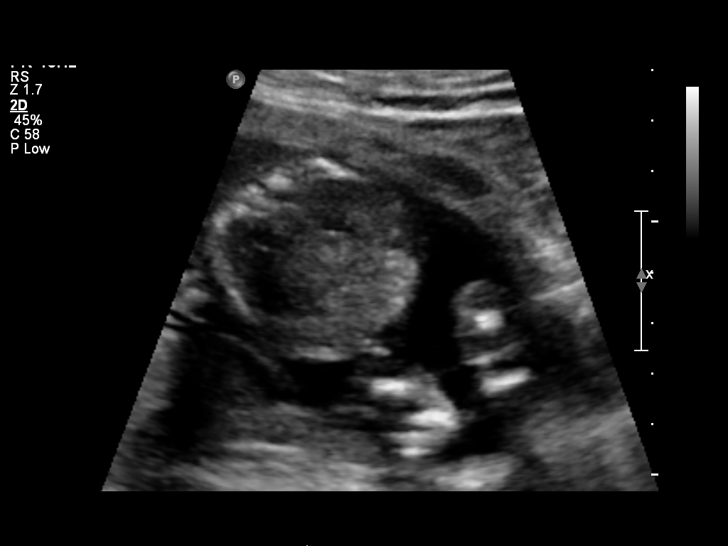
[im 69/98]
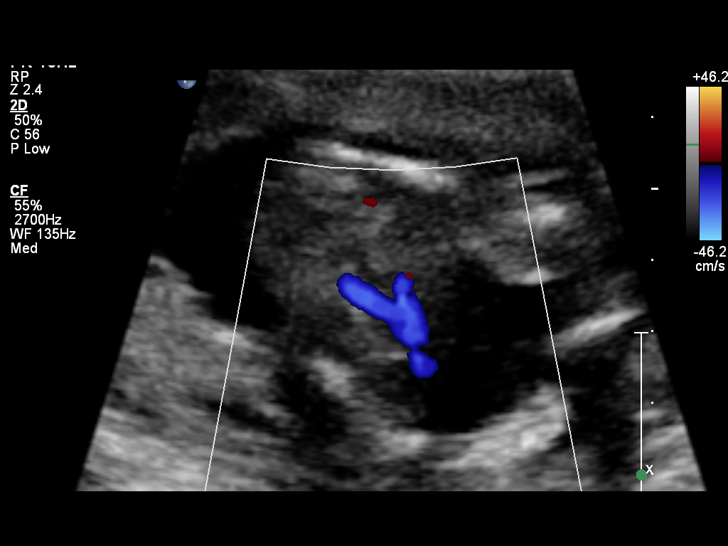
[im 80/98]
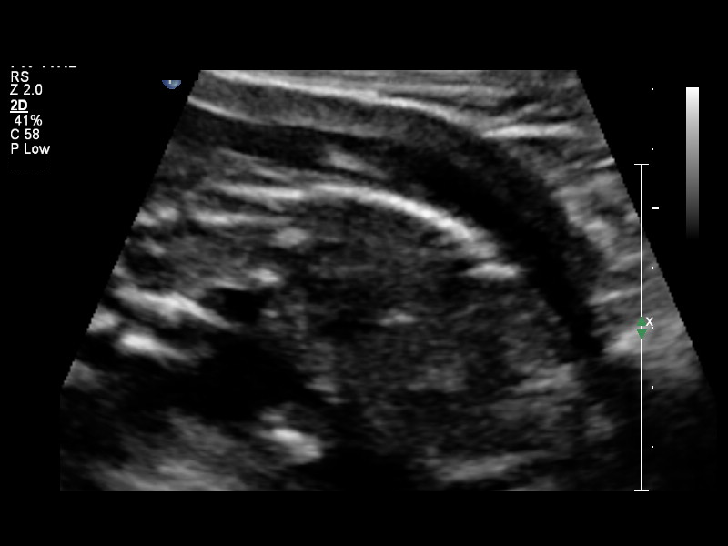
[im 87/98]
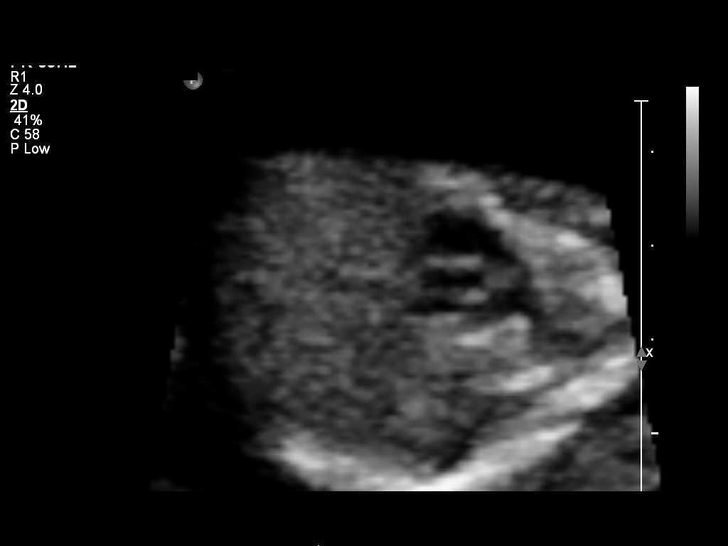
[im 94/98]
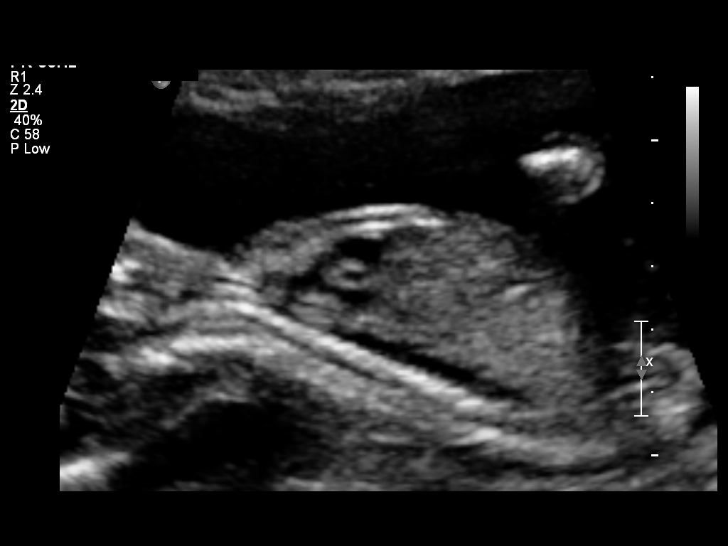

[12 of 28 positions shown; findings below may reference images not displayed]

OBSTETRICS REPORT

                                                         PA-C
Service(s) Provided

 US OB DETAIL + 14 WK                                  76811.0
Indications

 Detailed fetal anatomic survey
Fetal Evaluation

 Num Of Fetuses:    1
 Fetal Heart Rate:  150                          bpm
 Cardiac Activity:  Observed
 Presentation:      Breech
 Placenta:          Posterior, above cervical
                    os
 P. Cord            Visualized, central
 Insertion:

 Amniotic Fluid
 AFI FV:      Subjectively within normal limits
                                             Larg Pckt:     4.7  cm
Biometry

 BPD:     36.7  mm     G. Age:  17w 1d                CI:         66.4   70 - 86
                                                      FL/HC:      18.2   15.8 -
                                                                         18
 HC:     144.4  mm     G. Age:  17w 4d       21  %    HC/AC:      1.16   1.07 -

 AC:     124.2  mm     G. Age:  18w 0d       44  %    FL/BPD:
 FL:      26.3  mm     G. Age:  18w 0d       40  %    FL/AC:      21.2   20 - 24
 HUM:     24.2  mm     G. Age:  17w 4d       36  %
 CER:       17  mm     G. Age:  17w 0d       23  %
 NFT:     4.25  mm

 Est. FW:     218  gm      0 lb 8 oz     45  %
Gestational Age

 U/S Today:     17w 5d                                        EDD:   04/03/13
 Best:          18w 1d     Det. By:  Early Ultrasound         EDD:   03/31/13
                                     (08/04/12)
Anatomy
 Cranium:          Appears normal         Aortic Arch:      Appears normal
 Fetal Cavum:      Appears normal         Ductal Arch:      Appears normal
 Ventricles:       Appears normal         Diaphragm:        Appears normal
 Choroid Plexus:   Appears normal         Stomach:          Appears normal
 Cerebellum:       Appears normal         Abdomen:          Appears normal
 Posterior Fossa:  Appears normal         Abdominal Wall:   Appears nml (cord
                                                            insert, abd wall)
 Nuchal Fold:      Appears normal         Cord Vessels:     Appears normal (3
                                                            vessel cord)
 Face:             Appears normal         Kidneys:          Appear normal
                   (orbits and profile)
 Lips:             Appears normal         Bladder:          Appears normal
 Heart:            Appears normal         Spine:            Appears normal
                   (4CH, axis, and
                   situs)
 RVOT:             Appears normal         Lower             Appears normal
                                          Extremities:
 LVOT:             Appears normal         Upper             Appears normal
                                          Extremities:

 Other:  Fetus appears to be a female. Heels and 5th digit visualized.
Targeted Anatomy

 Fetal Central Nervous System
 Lat. Ventricles:  7.0                    Cisterna Magna:
Cervix Uterus Adnexa

 Cervical Length:    3.96     cm

 Cervix:       Normal appearance by transabdominal scan.
 Uterus:       No abnormality visualized.
 Cul De Sac:   No free fluid seen.
 Left Ovary:    Within normal limits.
 Right Ovary:   Within normal limits.
 Adnexa:     No abnormality visualized.
Impression

 Single IUP at 18 [DATE] weeks
 Normal fetal anatomic survey
 No markers associated with aneuploidy noted
 Posterior placenta without previa
 Normal amniotic fluid volume
Recommendations

 Follow-up ultrasounds as clinically indicated.

 questions or concerns.
                 Attending Physician, GHOLAM HOSSEIN

## 2015-07-09 ENCOUNTER — Encounter (HOSPITAL_COMMUNITY): Payer: Self-pay | Admitting: Emergency Medicine

## 2015-07-09 ENCOUNTER — Ambulatory Visit (HOSPITAL_COMMUNITY)
Admission: EM | Admit: 2015-07-09 | Discharge: 2015-07-09 | Disposition: A | Discharge status: Home / Self Care | Payer: Medicaid Other | Attending: Physician Assistant | Admitting: Physician Assistant

## 2015-07-09 DIAGNOSIS — H65112 Acute and subacute allergic otitis media (mucoid) (sanguinous) (serous), left ear: Secondary | ICD-10-CM

## 2015-07-09 MED ORDER — AMOXICILLIN 500 MG PO CAPS: 500.0000 mg | ORAL_CAPSULE | Freq: Three times a day (TID) | ORAL | Status: DC

## 2015-07-09 NOTE — Discharge Instructions (Signed)
Otitis media - Adultos  (Otitis Media, Adult)  La otitis media es el enrojecimiento, el dolor y la inflamación del oído medio. La causa de la otitis media puede ser una alergia o, más frecuentemente, una infección. Muchas veces ocurre como una complicación de un resfrío común.  SIGNOS Y SÍNTOMAS  Los síntomas de la otitis media son:  · Dolor de oídos.  · Fiebre.  · Zumbidos en el oído.  · Dolor de cabeza.  · Pérdida de líquido por el oído.  DIAGNÓSTICO  Para diagnosticar la otitis media, el médico examinará su oído con un otoscopio. Este instrumento le permite al médico observar el interior del oído y examinar el tímpano. El médico le preguntará acerca de sus síntomas.  TRATAMIENTO   Generalmente la otitis media mejora sin tratamiento entre 3 y los 5 días. El médico podrá recetar algunos medicamentos para calmar el dolor. Si la otitis media no mejora dentro de los 5 días o es recurrente, el médico puede prescribir antibióticos si sospecha que la causa es una infección bacteriana.  INSTRUCCIONES PARA EL CUIDADO EN EL HOGAR    · Si le recetaron antibióticos, asegúrese de terminarlos, incluso si comienza a sentirse mejor.  · Tome los medicamentos solamente como se lo haya indicado el médico.  · Concurra a todas las visitas de control como se lo haya indicado el médico.  SOLICITE ATENCIÓN MÉDICA SI:  · Tiene otitis media solo en un oído o sangra por la nariz, o ambas cosas.  · Advierte un bulto en el cuello.  · No mejora luego de 3 a 5 días.  · Empeora en lugar de mejorar.  SOLICITE ATENCIÓN MÉDICA DE INMEDIATO SI:   · Aumenta el dolor y no puede controlarlo con la medicación.  · Observa hinchazón, irritación o dolor alrededor del oído o rigidez en el cuello.  · Nota que una parte de su rostro está paralizada.  · Nota que el hueso que se encuentra detrás de la oreja (mastoides) le duele al tocarlo.  ASEGÚRESE DE QUE:   · Comprende estas instrucciones.  · Controlará su afección.  · Recibirá ayuda de inmediato si no  mejora o si empeora.     Esta información no tiene como fin reemplazar el consejo del médico. Asegúrese de hacerle al médico cualquier pregunta que tenga.     Document Released: 10/27/2004 Document Revised: 02/07/2014  Elsevier Interactive Patient Education ©2016 [employer]

## 2015-07-09 NOTE — ED Provider Notes (Signed)
CSN: [id number]     Arrival date & time 07/09/15  1403 History   None    Chief Complaint  Patient presents with  . Ear Problem   (Consider location/radiation/quality/duration/timing/severity/associated sxs/prior Treatment) Patient is a 30 y.o. female presenting with ear pain. The history is provided by the patient. No language interpreter was used.  Otalgia Location:  Right Quality:  Aching Severity:  Moderate Onset quality:  Gradual Duration:  1 week Timing:  Constant Progression:  Worsening Chronicity:  New Relieved by:  Nothing Worsened by:  Nothing tried Ineffective treatments:  None tried Associated symptoms: sore throat and tinnitus     Past Medical History  Diagnosis Date  . Medical history non-contributory    Past Surgical History  Procedure Laterality Date  . No past surgeries     Family History  Problem Relation Age of Onset  . Diabetes Other   . Diabetes Mother   . Diabetes Paternal Aunt   . Diabetes Paternal Grandmother    Social History  Substance Use Topics  . Smoking status: Never Smoker   . Smokeless tobacco: Never Used  . Alcohol Use: No   OB History    Gravida Para Term Preterm AB TAB SAB Ectopic Multiple Living   Review of Systems  HENT: Positive for ear pain, sore throat and tinnitus.   All other systems reviewed and are negative.   Allergies  Pork-derived products  Home Medications   Prior to Admission medications   Medication Sig Start Date End Date Taking? Authorizing Provider  OVER THE COUNTER MEDICATION Reports using ear drops, but does not know the name   Yes Historical Provider, MD  amoxicillin (AMOXIL) 500 MG capsule Take 1 capsule (500 mg total) by mouth 3 (three) times daily. 07/09/15   Elson Areas, PA-C  norethindrone (MICRONOR,CAMILA,ERRIN) 0.35 MG tablet Take 1 tablet (0.35 mg total) by mouth daily. 05/02/13   Reva Bores, MD  Prenatal Vit-Fe Fumarate-FA (MULTIVITAMIN-PRENATAL) 27-0.8 MG TABS tablet  Take 1 tablet by mouth daily at 12 noon.    Historical Provider, MD   Meds Ordered and Administered this Visit  Medications - No data to display  BP 149/82 mmHg  Pulse 86  Temp(Src) 98.6 F (37 C) (Oral)  Resp 16  SpO2 96%  LMP 06/15/2015 No data found.   Physical Exam  Constitutional: She is oriented to person, place, and time. She appears well-developed and well-nourished.  HENT:  Head: Normocephalic.  Nose: Nose normal.  Mouth/Throat: Oropharynx is clear and moist.  Left tm erythematous bulging  Eyes: Conjunctivae and EOM are normal. Pupils are equal, round, and reactive to light.  Neck: Normal range of motion.  Cardiovascular: Normal rate.   Pulmonary/Chest: Effort normal.  Abdominal: She exhibits no distension.  Musculoskeletal: Normal range of motion.  Neurological: She is alert and oriented to person, place, and time.  Skin: Skin is warm.  Psychiatric: She has a normal mood and affect.  Nursing note and vitals reviewed.   ED Course  Procedures (including critical care time)  Labs Review Labs Reviewed - No data to display  Imaging Review No results found.   Visual Acuity Review  Right Eye Distance:   Left Eye Distance:   Bilateral Distance:    Right Eye Near:   Left Eye Near:    Bilateral Near:         MDM   1. Acute mucoid otitis  media of left ear    Meds ordered this encounter  Medications  . OVER THE COUNTER MEDICATION    Sig: Reports using ear drops, but does not know the name  . DISCONTD: amoxicillin (AMOXIL) 500 MG capsule    Sig: Take 1 capsule (500 mg total) by mouth 3 (three) times daily.    Dispense:  30 capsule    Refill:  0    Order Specific Question:  Supervising Provider    Answer:  Linna HoffKINDL, JAMES D [phone]]  . amoxicillin (AMOXIL) 500 MG capsule    Sig: Take 1 capsule (500 mg total) by mouth 3 (three) times daily.    Dispense:  30 capsule    Refill:  0    Order Specific Question:  Supervising Provider    Answer:  Linna HoffKINDL,  JAMES D [phone][[id number]]  An After Visit Summary was printed and given to the patient.    Tracie SkinnerLeslie K RicardoSofia, PA-C 07/09/15 [phone]

## 2015-07-09 NOTE — ED Notes (Signed)
Patient requested translator.  Utilized [employer] translator services.  Patient reports difficulty hearing in both ears, ears are "buzzing".  Reports "pulsating" pain at times.  And at other times ears are itchy.  reports a cough, no fever.

## 2015-09-04 ENCOUNTER — Ambulatory Visit (HOSPITAL_COMMUNITY)
Admission: EM | Admit: 2015-09-04 | Discharge: 2015-09-04 | Disposition: A | Discharge status: Home / Self Care | Payer: Self-pay | Attending: Emergency Medicine | Admitting: Emergency Medicine

## 2015-09-04 ENCOUNTER — Encounter (HOSPITAL_COMMUNITY): Payer: Self-pay | Admitting: Emergency Medicine

## 2015-09-04 DIAGNOSIS — H6502 Acute serous otitis media, left ear: Secondary | ICD-10-CM

## 2015-09-04 DIAGNOSIS — R062 Wheezing: Secondary | ICD-10-CM

## 2015-09-04 DIAGNOSIS — J069 Acute upper respiratory infection, unspecified: Secondary | ICD-10-CM

## 2015-09-04 MED ORDER — AMOXICILLIN-POT CLAVULANATE 875-125 MG PO TABS: 1.0000 | ORAL_TABLET | Freq: Two times a day (BID) | ORAL | 0 refills | Status: DC

## 2015-09-04 MED ORDER — ALBUTEROL SULFATE HFA 108 (90 BASE) MCG/ACT IN AERS: 2.0000 | INHALATION_SPRAY | RESPIRATORY_TRACT | 0 refills | Status: DC | PRN

## 2015-09-04 MED ORDER — IPRATROPIUM BROMIDE 0.02 % IN SOLN
RESPIRATORY_TRACT | Status: AC
  Filled 2015-09-04: qty 2.5

## 2015-09-04 MED ORDER — ALBUTEROL SULFATE (2.5 MG/3ML) 0.083% IN NEBU
INHALATION_SOLUTION | RESPIRATORY_TRACT | Status: AC
  Filled 2015-09-04: qty 3

## 2015-09-04 MED ORDER — IPRATROPIUM-ALBUTEROL 0.5-2.5 (3) MG/3ML IN SOLN
3.0000 mL | Freq: Once | RESPIRATORY_TRACT | Status: AC
  Administered 2015-09-04: 3 mL via RESPIRATORY_TRACT

## 2015-09-04 NOTE — ED Triage Notes (Signed)
PT reports sore throat, SOB, "hot face" and body aches.

## 2015-09-04 NOTE — ED Notes (Signed)
Madaline Guthrie [phone] used as interpreter for discharge instructions. No questions at discharge

## 2015-09-04 NOTE — ED Provider Notes (Signed)
[facility]    CSN: [id number] Arrival date & time: 09/04/15  1249  First Provider Contact:  First MD Initiated Contact with Patient 09/04/15 1338        History   Chief Complaint Chief Complaint  Patient presents with  . Sore Throat    HPI Tracie [name] is a 30 y.o. female.   She is a 30 year old woman here for evaluation of sore throat and trouble breathing. A Spanish interpreter was used for this encounter. She states since yesterday she has had sore throat, trouble breathing, and feeling warm in her face. She also reports achiness of her eyes and body. She reports a slight cough. No nasal congestion or rhinorrhea. She has taken her temperature at home, but has not had any fever. Throat pain is a little worse with swallowing, but she is able to eat and drink without difficulty. No nausea or vomiting. No known sick contacts. She took some Advil yesterday without much improvement.    Past Medical History:  Diagnosis Date  . Medical history non-contributory     There are no active problems to display for this patient.   Past Surgical History:  Procedure Laterality Date  . NO PAST SURGERIES      OB History    Gravida Para Term Preterm AB Living   SAB TAB Ectopic Multiple Live Births           2       Home Medications    Prior to Admission medications   Medication Sig Start Date End Date Taking? Authorizing Provider  albuterol (PROVENTIL HFA;VENTOLIN HFA) 108 (90 Base) MCG/ACT inhaler Inhale 2 puffs into the lungs every 4 (four) hours as needed for wheezing or shortness of breath. 09/04/15   Charm Rings, MD  amoxicillin-clavulanate (AUGMENTIN) 875-125 MG tablet Take 1 tablet by mouth 2 (two) times daily. 09/04/15   Charm Rings, MD  norethindrone (MICRONOR,CAMILA,ERRIN) 0.35 MG tablet Take 1 tablet (0.35 mg total) by mouth daily. 05/02/13   Reva Bores, MD  OVER THE COUNTER MEDICATION Reports using ear drops, but does not know the name     Historical Provider, MD  Prenatal Vit-Fe Fumarate-FA (MULTIVITAMIN-PRENATAL) 27-0.8 MG TABS tablet Take 1 tablet by mouth daily at 12 noon.    Historical Provider, MD    Family History Family History  Problem Relation Age of Onset  . Diabetes Other   . Diabetes Mother   . Diabetes Paternal Aunt   . Diabetes Paternal Grandmother     Social History Social History  Substance Use Topics  . Smoking status: Never Smoker  . Smokeless tobacco: Never Used  . Alcohol use No     Allergies   Pork-derived products   Review of Systems Review of Systems  Constitutional: Negative for appetite change and fever.  HENT: Positive for sore throat. Negative for congestion, ear pain, rhinorrhea and trouble swallowing.   Eyes:       Eyes ache  Respiratory: Positive for cough, shortness of breath and wheezing.   Gastrointestinal: Negative for nausea and vomiting.  Musculoskeletal: Positive for myalgias.  Skin: Negative for rash.  Neurological: Positive for headaches.     Physical Exam Triage Vital Signs ED Triage Vitals [09/04/15 1335]  Enc Vitals Group     BP 130/83     Pulse Rate 99     Resp 16     Temp 98.3 F (36.8 C)  Temp Source Oral     SpO2 96 %     Weight      Height      Head Circumference      Peak Flow      Pain Score      Pain Loc      Pain Edu?      Excl. in GC?    No data found.   Updated Vital Signs BP 130/83   Pulse 99   Temp 98.3 F (36.8 C) (Oral)   Resp 16   SpO2 96%   Visual Acuity Right Eye Distance:   Left Eye Distance:   Bilateral Distance:    Right Eye Near:   Left Eye Near:    Bilateral Near:     Physical Exam  Constitutional: She appears well-developed and well-nourished. No distress.  HENT:  Nose: Nose normal.  Mouth/Throat: No oropharyngeal exudate.  Oropharynx mildly erythematous. Left TM is erythematous with effusion. Right TM is normal.  Neck: Neck supple.  Cardiovascular: Normal rate, regular rhythm and normal heart  sounds.  Exam reveals no gallop.   No murmur heard. Pulmonary/Chest: Effort normal. No respiratory distress. She has wheezes (diffuse expiratory wheezes).  Fair to poor air movement  Lymphadenopathy:    She has no cervical adenopathy.     UC Treatments / Results  Labs (all labs ordered are listed, but only abnormal results are displayed) Labs Reviewed - No data to display  EKG  EKG Interpretation None       Radiology No results found.  Procedures Procedures (including critical care time)  Medications Ordered in UC Medications  ipratropium-albuterol (DUONEB) 0.5-2.5 (3) MG/3ML nebulizer solution 3 mL (3 mLs Nebulization Given 09/04/15 1410)     Initial Impression / Assessment and Plan / UC Course  I have reviewed the triage vital signs and the nursing notes.  Pertinent labs & imaging results that were available during my care of the patient were reviewed by me and considered in my medical decision making (see chart for details).  Clinical Course    Air movement much improved after DuoNeb. Wheezing is completely resolved. This is likely viral upper respiratory infection causing wheezing. She is developing an ear infection. Augmentin for the ear infection. Recently treated for ear infection with amoxicillin in June. Prescription for albuterol inhaler given to use as needed. Expect improvement over the next 2 days. Follow-up as needed.  Final Clinical Impressions(s) / UC Diagnoses   Final diagnoses:  URI (upper respiratory infection)  Wheezing  Acute serous otitis media of left ear, recurrence not specified    New Prescriptions New Prescriptions   ALBUTEROL (PROVENTIL HFA;VENTOLIN HFA) 108 (90 BASE) MCG/ACT INHALER    Inhale 2 puffs into the lungs every 4 (four) hours as needed for wheezing or shortness of breath.   AMOXICILLIN-CLAVULANATE (AUGMENTIN) 875-125 MG TABLET    Take 1 tablet by mouth 2 (two) times daily.     Charm Rings, MD 09/04/15 [phone]

## 2015-09-04 NOTE — ED Notes (Signed)
Tracie [name], [phone] used as phone interpreter

## 2015-09-04 NOTE — Discharge Instructions (Signed)
You have an infection that is causing you to wheeze. You also have a left ear infection. Take Augmentin twice a day for 10 days. Use the albuterol inhaler every 4-6 hours as needed for trouble breathing. You can take Tylenol or ibuprofen as needed for body aches. You should start to feel better in the next 2 days. Follow-up as needed.  Usted tiene una infeccin que le est causando sibilancias. Tambin tiene una infeccin del odo izquierdo. Tome Augmentin dos veces al da durante [address] Use el inhalador de albuterol cada 4-6 horas segn sea necesario para problemas para respirar. Puede tomar Tylenol o ibuprofeno segn sea necesario para los dolores corporales. Usted debe comenzar a sentirse Airline pilot los prximos 2 das. Seguimiento segn sea necesario.

## 2018-07-11 ENCOUNTER — Encounter (HOSPITAL_COMMUNITY): Payer: Self-pay | Admitting: *Deleted

## 2018-07-11 ENCOUNTER — Inpatient Hospital Stay (HOSPITAL_COMMUNITY): Payer: Self-pay

## 2018-07-11 ENCOUNTER — Inpatient Hospital Stay (HOSPITAL_COMMUNITY)
Admission: AD | Admit: 2018-07-11 | Discharge: 2018-07-11 | Disposition: A | Discharge status: Home / Self Care | Payer: Self-pay | Attending: Obstetrics & Gynecology | Admitting: Obstetrics & Gynecology

## 2018-07-11 ENCOUNTER — Other Ambulatory Visit: Payer: Self-pay

## 2018-07-11 DIAGNOSIS — O209 Hemorrhage in early pregnancy, unspecified: Secondary | ICD-10-CM | POA: Insufficient documentation

## 2018-07-11 DIAGNOSIS — O3680X0 Pregnancy with inconclusive fetal viability, not applicable or unspecified: Secondary | ICD-10-CM

## 2018-07-11 DIAGNOSIS — Z3A14 14 weeks gestation of pregnancy: Secondary | ICD-10-CM | POA: Insufficient documentation

## 2018-07-11 DIAGNOSIS — R109 Unspecified abdominal pain: Secondary | ICD-10-CM | POA: Insufficient documentation

## 2018-07-11 LAB — POCT PREGNANCY, URINE: Preg Test, Ur: POSITIVE — AB

## 2018-07-11 LAB — URINALYSIS, ROUTINE W REFLEX MICROSCOPIC
Bilirubin Urine: NEGATIVE
Glucose, UA: NEGATIVE mg/dL
Ketones, ur: NEGATIVE mg/dL
Nitrite: NEGATIVE
Protein, ur: NEGATIVE mg/dL
Specific Gravity, Urine: 1.01 (ref 1.005–1.030)
pH: 6 (ref 5.0–8.0)

## 2018-07-11 LAB — CBC
HCT: 40.3 % (ref 36.0–46.0)
Hemoglobin: 13.9 g/dL (ref 12.0–15.0)
MCH: 30.9 pg (ref 26.0–34.0)
MCHC: 34.5 g/dL (ref 30.0–36.0)
MCV: 89.6 fL (ref 80.0–100.0)
Platelets: 224 10*3/uL (ref 150–400)
RBC: 4.5 MIL/uL (ref 3.87–5.11)
RDW: 12.4 % (ref 11.5–15.5)
WBC: 12.5 10*3/uL — ABNORMAL HIGH (ref 4.0–10.5)
nRBC: 0 % (ref 0.0–0.2)

## 2018-07-11 LAB — WET PREP, GENITAL
Sperm: NONE SEEN
Trich, Wet Prep: NONE SEEN
Yeast Wet Prep HPF POC: NONE SEEN

## 2018-07-11 LAB — HCG, QUANTITATIVE, PREGNANCY: hCG, Beta Chain, Quant, S: 19377 m[IU]/mL — ABNORMAL HIGH (ref <5)

## 2018-07-11 NOTE — MAU Note (Signed)
.   Tracie [name] is a 33 y.o. at [redacted]w[redacted]d here in MAU reporting:vaginal bleeding with lower abdominal cramping since yesterday. Last intercourse 2 days ago. Menstrual cycles are irregular so unsure of her due date LMP: 04/01/2018 Onset of complaint: yesterday Pain score: 4 Vitals:   07/11/18 1152  BP: 123/69  Pulse: 85  Resp: 16  Temp: 98 F (36.7 C)  SpO2: 100%      Lab orders placed from triage:UPT/UA

## 2018-07-11 NOTE — MAU Provider Note (Signed)
Chief Complaint: Vaginal Bleeding and Abdominal Pain   First Provider Initiated Contact with Patient 07/11/18 1308     SUBJECTIVE HPI: Bertram Millarderesa Hunger is a 33 y.o. G3P2002 at 7438w3d by unsure LMP who presents to [facility]missions reporting abdominal cramping & vaginal bleeding. Had her first positive HPT sometime in May. Her last period was in March but reports she never has regular periods & is unsure when she got pregnant.  Describes brown spotting on toilet paper when she wipes. Bleeding started yesterday. Last had intercourse 2 days ago.  Reports intermittent lower abdominal cramping for over a month. Pain has not changed.  Denies n/v/d, constipation, vaginal discharge, or dysuria.   Location: abdomen Quality: cramping Severity: 4/10 on pain scale Duration: 1 month Timing: intermittent Modifying factors: none Associated signs and symptoms: vaginal bleeding  Past Medical History:  Diagnosis Date  . Medical history non-contributory    OB History  Gravida Para Term Preterm AB Living  3 2 2     2   SAB TAB Ectopic Multiple Live Births          2    # Outcome Date GA Lbr Len/2nd Weight Sex Delivery Anes PTL Lv  3 Current           2 Term 04/04/13 3487w4d 36:56 / 00:15 3485 g F Vag-Spont None  LIV  1 Term 10/03/04 1835w4d  2495 g F Vag-Spont  N LIV     Birth Comments: SGA   Past Surgical History:  Procedure Laterality Date  . NO PAST SURGERIES     Social History   Socioeconomic History  . Marital status: Single    Spouse name: Not on file  . Number of children: Not on file  . Years of education: Not on file  . Highest education level: Not on file  Occupational History  . Not on file  Social Needs  . Financial resource strain: Not on file  . Food insecurity:    Worry: Not on file    Inability: Not on file  . Transportation needs:    Medical: Not on file    Non-medical: Not on file  Tobacco Use  . Smoking status: Never Smoker  . Smokeless tobacco: Never Used  Substance  and Sexual Activity  . Alcohol use: No  . Drug use: No  . Sexual activity: Yes    Birth control/protection: None  Lifestyle  . Physical activity:    Days per week: Not on file    Minutes per session: Not on file  . Stress: Not on file  Relationships  . Social connections:    Talks on phone: Not on file    Gets together: Not on file    Attends religious service: Not on file    Active member of club or organization: Not on file    Attends meetings of clubs or organizations: Not on file    Relationship status: Not on file  . Intimate partner violence:    Fear of current or ex partner: Not on file    Emotionally abused: Not on file    Physically abused: Not on file    Forced sexual activity: Not on file  Other Topics Concern  . Not on file  Social History Narrative  . Not on file   Family History  Problem Relation Age of Onset  . Diabetes Other   . Diabetes Mother   . Diabetes Paternal Aunt   . Diabetes Paternal Grandmother    No current  facility-administered medications on file prior to encounter.    Current Outpatient Medications on File Prior to Encounter  Medication Sig Dispense Refill  . albuterol (PROVENTIL HFA;VENTOLIN HFA) 108 (90 Base) MCG/ACT inhaler Inhale 2 puffs into the lungs every 4 (four) hours as needed for wheezing or shortness of breath. 1 Inhaler 0  . amoxicillin-clavulanate (AUGMENTIN) 875-125 MG tablet Take 1 tablet by mouth 2 (two) times daily. 20 tablet 0  . norethindrone (MICRONOR,CAMILA,ERRIN) 0.35 MG tablet Take 1 tablet (0.35 mg total) by mouth daily. 1 Package 11  . OVER THE COUNTER MEDICATION Reports using ear drops, but does not know the name    . Prenatal Vit-Fe Fumarate-FA (MULTIVITAMIN-PRENATAL) 27-0.8 MG TABS tablet Take 1 tablet by mouth daily at 12 noon.     Allergies  Allergen Reactions  . Pork-Derived Conservation officer, natureroducts Hives    I have reviewed patient's Past Medical Hx, Surgical Hx, Family Hx, Social Hx, medications and allergies.   Review  of Systems  Constitutional: Negative.   Gastrointestinal: Positive for abdominal pain. Negative for constipation, diarrhea, nausea and vomiting.  Genitourinary: Positive for vaginal bleeding. Negative for dysuria and vaginal discharge.    OBJECTIVE Patient Vitals for the past 24 hrs:  BP Temp Pulse Resp SpO2  07/11/18 1536 122/75 - 88 - -  07/11/18 1530 - - - 16 -  07/11/18 1152 123/69 98 F (36.7 C) 85 16 100 %   Constitutional: Well-developed, well-nourished female in no acute distress.  Cardiovascular: normal rate & rhythm, no murmur Respiratory: normal rate and effort. Lung sounds clear throughout GI: Abd soft, non-tender, Pos BS x 4. No guarding or rebound tenderness MS: Extremities nontender, no edema, normal ROM Neurologic: Alert and oriented x 4.  GU:     SPECULUM EXAM: NEFG, small amount of brown mucoid blood. No active bleeding. No abnormal discharge. Cervix pink/smooth.   BIMANUAL: No CMT. cervix closed; uterus normal size, no adnexal tenderness or masses.    LAB RESULTS Results for orders placed or performed during the hospital encounter of 07/11/18 (from the past 24 hour(s))  Pregnancy, urine POC     Status: Abnormal   Collection Time: 07/11/18 11:41 AM  Result Value Ref Range   Preg Test, Ur POSITIVE (A) NEGATIVE  Urinalysis, Routine w reflex microscopic     Status: Abnormal   Collection Time: 07/11/18 11:55 AM  Result Value Ref Range   Color, Urine YELLOW YELLOW   APPearance HAZY (A) CLEAR   Specific Gravity, Urine 1.010 1.005 - 1.030   pH 6.0 5.0 - 8.0   Glucose, UA NEGATIVE NEGATIVE mg/dL   Hgb urine dipstick LARGE (A) NEGATIVE   Bilirubin Urine NEGATIVE NEGATIVE   Ketones, ur NEGATIVE NEGATIVE mg/dL   Protein, ur NEGATIVE NEGATIVE mg/dL   Nitrite NEGATIVE NEGATIVE   Leukocytes,Ua TRACE (A) NEGATIVE   RBC / HPF 0-5 0 - 5 RBC/hpf   WBC, UA 0-5 0 - 5 WBC/hpf   Bacteria, UA RARE (A) NONE SEEN   Squamous Epithelial / LPF 0-5 0 - 5   Mucus PRESENT     Hyaline Casts, UA PRESENT   Wet prep, genital     Status: Abnormal   Collection Time: 07/11/18  1:23 PM  Result Value Ref Range   Yeast Wet Prep HPF POC NONE SEEN NONE SEEN   Trich, Wet Prep NONE SEEN NONE SEEN   Clue Cells Wet Prep HPF POC PRESENT (A) NONE SEEN   WBC, Wet Prep HPF POC MANY (A) NONE SEEN  Sperm NONE SEEN   CBC     Status: Abnormal   Collection Time: 07/11/18  1:48 PM  Result Value Ref Range   WBC 12.5 (H) 4.0 - 10.5 K/uL   RBC 4.50 3.87 - 5.11 MIL/uL   Hemoglobin 13.9 12.0 - 15.0 g/dL   HCT 16.140.3 09.636.0 - 04.546.0 %   MCV 89.6 80.0 - 100.0 fL   MCH 30.9 26.0 - 34.0 pg   MCHC 34.5 30.0 - 36.0 g/dL   RDW 40.912.4 81.111.5 - 91.415.5 %   Platelets 224 150 - 400 K/uL   nRBC 0.0 0.0 - 0.2 %  hCG, quantitative, pregnancy     Status: Abnormal   Collection Time: 07/11/18  1:48 PM  Result Value Ref Range   hCG, Beta Chain, Quant, S 19,377 (H) <5 mIU/mL    IMAGING [place] Ob Less Than 14 Weeks With Ob Transvaginal  Result Date: 07/11/2018 CLINICAL DATA:  Vaginal bleeding in pregnancy. EXAM: OBSTETRIC <14 WK US AND TRANSVAGINAL OB US TECHNIQUE: Both transabdominal and transvaginal ultrasound examinations were performed for complete evaluation of the gestation as well as the maternal uterus, adnexal regions, and pelvic cul-de-sac. Transvaginal technique was performed to assess early pregnancy. COMPARISON:  None. FINDINGS: Intrauterine gestational sac: Possible early gestational sac. Yolk sac:  Not Visualized. Embryo:  Not Visualized. Cardiac Activity: Not Visualized. MSD: 9.56 mm   5 w   5 d CRL:    mm    w    d                  US EDC: Subchorionic hemorrhage:  Suggested small subchorionic hemorrhage. Maternal uterus/adnexae: Corpus luteum cyst in the right ovary. The left ovary is normal. IMPRESSION: 1. Probable early intrauterine gestational sac, but no yolk sac, fetal pole, or cardiac activity yet visualized. Recommend follow-up quantitative B-HCG levels and follow-up US in 14 days to assess  viability. This recommendation follows SRU consensus guidelines: Diagnostic Criteria for Nonviable Pregnancy Early in the First Trimester. Malva Limes Engl J Med 2013; 782:9562-13; 369:1443-51. Electronically Signed   By: Gerome Samavid  Williams III M.D   On: 07/11/2018 14:32    MAU COURSE Orders Placed This Encounter  Procedures  . Wet prep, genital  . US OB LESS THAN 14 WEEKS WITH OB TRANSVAGINAL  . Urinalysis, Routine w reflex microscopic  . CBC  . hCG, quantitative, pregnancy  . HIV Antibody (routine testing w rflx)  . Pregnancy, urine POC  . Discharge patient   No orders of the defined types were placed in this encounter.   MDM +UPT UA, wet prep, GC/chlamydia, CBC, ABO/Rh, quant hCG, and US today to rule out ectopic pregnancy  RH positive  ~14 wks based on very unsure LMP. Unable to doppler FHTs. Uterus does not feel enlarged on exam.   Ultrasound shows empty "tiny" IUGS. HCG 19,000 This vaginal bleeding & abdominal cramping could represent a normal pregnancy, spontaneous abortion, or even an ectopic pregnancy which can be life-threatening. Cultures were obtained to rule out pelvic infection.   ASSESSMENT 1. Pregnancy of unknown anatomic location   2. Vaginal bleeding in pregnancy, first trimester     PLAN Discharge home in stable condition. SAB vs ectopic precautions GC/CT pending Return for repeat HCG on Friday or Saturday  Follow-up Information    [facility]. Go on 07/13/2018.   Specialty:  Obstetrics and Gynecology Why:  Return friday or saturday for blood work SolicitorContact information: [address] [id number] [place]  [phone]         Allergies as of 07/11/2018      Reactions   Pork-derived Products Hives      Medication List    STOP taking these medications   amoxicillin-clavulanate 875-125 MG tablet Commonly known as:  Augmentin   norethindrone 0.35 MG tablet Commonly known as:  MICRONOR     TAKE these  medications   albuterol 108 (90 Base) MCG/ACT inhaler Commonly known as:  VENTOLIN HFA Inhale 2 puffs into the lungs every 4 (four) hours as needed for wheezing or shortness of breath.   multivitamin-prenatal 27-0.8 MG Tabs tablet Take 1 tablet by mouth daily at 12 noon.   OVER THE COUNTER MEDICATION Reports using ear drops, but does not know the name        Jorje Guild, NP 07/11/2018  3:36 PM

## 2018-07-11 NOTE — Discharge Instructions (Signed)
Hemorragia vaginal durante el embarazo (primer trimestre) Vaginal Bleeding During Pregnancy, First Trimester  Durante los primeros meses del [place], es comn tener una pequea hemorragia vaginal [place]). A veces, la hemorragia es normal y no causa problemas. En otras ocasiones, sin embargo, la hemorragia puede ser una seal de algo grave. Informe a su mdico de inmediato si tiene algn tipo de hemorragia vaginal. Siga estas indicaciones en su casa: Actividad  Siga las indicaciones de su mdico con respecto al grado de actividad que puede Optometrist.  De ser necesario, organcese para que alguien la ayude con las actividades habituales.  No tenga relaciones sexuales ni orgasmos hasta que el mdico le diga que es seguro. Instrucciones generales  [employer] de venta libre y los recetados solamente como se lo haya indicado el mdico.  Controle su afeccin para Actuary cambio.  Escriba los siguientes datos: ? La cantidad de toallas higinicas que [place] cada da. ? La frecuencia con la que se cambia las toallas higinicas. ? Qu tan empapadas (saturadas) estn.  No use tampones.  No se haga duchas vaginales.  Si elimina tejido por la vagina, gurdelo para mostrrselo al [employer].  Concurra a todas las visitas de seguimiento como se lo haya indicado el mdico. Esto es importante. Comunquese con un mdico si:  Tiene una hemorragia vaginal durante el embarazo.  Tiene clicos.  Tiene fiebre. Solicite ayuda de inmediato si:  Siente clicos muy intensos en la espalda o en el vientre (abdomen).  Elimina cogulos grandes o mucho tejido por la vagina.  La hemorragia empeora.  Siente que va a desvanecerse.  Se siente dbil.  Pierde el conocimiento (se desmaya).  Tiene escalofros.  Tiene una prdida de lquido por la vagina.  Tiene una prdida de lquido abundante por la vagina. Resumen  A veces, la hemorragia vaginal durante el embarazo es normal y no  causa problemas. En otras ocasiones, la hemorragia puede ser [employer] de algo grave.  Informe a su mdico de inmediato si tiene algn tipo de hemorragia vaginal.  Siga las indicaciones de su mdico con respecto al grado de actividad que puede Optometrist. Quiz necesite que alguien la ayude con las actividades habituales. Esta informacin no tiene Marine scientist el consejo del mdico. Asegrese de hacerle al mdico cualquier pregunta que tenga. Document Released: 06/03/2013 Document Revised: 10/13/2016 Document Reviewed: 10/13/2016 Elsevier Interactive Patient Education  2019 [employer].

## 2018-07-12 LAB — HIV ANTIBODY (ROUTINE TESTING W REFLEX): HIV Screen 4th Generation wRfx: NONREACTIVE

## 2018-07-12 LAB — GC/CHLAMYDIA PROBE AMP (~~LOC~~) NOT AT ARMC
Chlamydia: NEGATIVE
Neisseria Gonorrhea: NEGATIVE

## 2018-07-13 ENCOUNTER — Inpatient Hospital Stay (HOSPITAL_COMMUNITY)
Admission: AD | Admit: 2018-07-13 | Discharge: 2018-07-13 | Disposition: A | Discharge status: Home / Self Care | Payer: Self-pay | Attending: Obstetrics & Gynecology | Admitting: Obstetrics & Gynecology

## 2018-07-13 ENCOUNTER — Other Ambulatory Visit: Payer: Self-pay

## 2018-07-13 DIAGNOSIS — Z3A14 14 weeks gestation of pregnancy: Secondary | ICD-10-CM | POA: Insufficient documentation

## 2018-07-13 DIAGNOSIS — O0281 Inappropriate change in quantitative human chorionic gonadotropin (hCG) in early pregnancy: Secondary | ICD-10-CM | POA: Insufficient documentation

## 2018-07-13 LAB — HCG, QUANTITATIVE, PREGNANCY: hCG, Beta Chain, Quant, S: 17066 m[IU]/mL — ABNORMAL HIGH (ref <5)

## 2018-07-13 NOTE — MAU Provider Note (Signed)
Subjective:  Tracie [name] is a 33 y.o. G3P2002 at [redacted]w[redacted]d who presents today for FU BHCG. She was seen on 07/11/2018. Results from that day show no IUP on [place], and HCG 19,377 . She reports vaginal bleeding, scant amount only when she wipes. She denies abdominal or pelvic pain.  Objective:  Physical Exam  Nursing note and vitals reviewed. Constitutional: She is oriented to person, place, and time. She appears well-developed and well-nourished. No distress.  HENT:  Head: Normocephalic.  Cardiovascular: Normal rate.  Respiratory: Effort normal.  GI: Soft. There is no tenderness.  Neurological: She is alert and oriented to person, place, and time. Skin: Skin is warm and dry.  Psychiatric: She has a normal mood and affect.   Results for orders placed or performed during the hospital encounter of 07/13/18 (from the past 24 hour(s))  hCG, quantitative, pregnancy     Status: Abnormal   Collection Time: 07/13/18  2:59 PM  Result Value Ref Range   hCG, Beta Chain, Quant, S 17,066 (H) <5 mIU/mL    Assessment/Plan: Pregnancy of unknown location HCG did not rise appropriately FU in 2 days for: serial quants O positive blood type Strict ectopic precautions Return to MAU if symptoms worsen Pelvic rest Support given   Noni Saupe I, NP 07/13/2018 4:29 PM

## 2018-07-13 NOTE — MAU Note (Signed)
Was here on the 10th for bleeding, was told to return today.  Still bleeding, yesterday was less, today a little more then yesterday. No pain.  (side noted, husband started having symptoms 3 wks ago, he has been totally isolated since then, is going today to get tested to be cleared for work)

## 2018-07-15 ENCOUNTER — Inpatient Hospital Stay (HOSPITAL_COMMUNITY)
Admission: AD | Admit: 2018-07-15 | Discharge: 2018-07-15 | Disposition: A | Discharge status: Home / Self Care | Payer: Self-pay | Source: Ambulatory Visit | Attending: Obstetrics & Gynecology | Admitting: Obstetrics & Gynecology

## 2018-07-15 ENCOUNTER — Other Ambulatory Visit: Payer: Self-pay

## 2018-07-15 DIAGNOSIS — Z3A15 15 weeks gestation of pregnancy: Secondary | ICD-10-CM | POA: Insufficient documentation

## 2018-07-15 DIAGNOSIS — O26892 Other specified pregnancy related conditions, second trimester: Secondary | ICD-10-CM | POA: Insufficient documentation

## 2018-07-15 DIAGNOSIS — O3680X0 Pregnancy with inconclusive fetal viability, not applicable or unspecified: Secondary | ICD-10-CM

## 2018-07-15 LAB — HCG, QUANTITATIVE, PREGNANCY: hCG, Beta Chain, Quant, S: 13317 m[IU]/mL — ABNORMAL HIGH (ref <5)

## 2018-07-15 NOTE — Discharge Instructions (Signed)
Primer trimestre de embarazo  First Trimester of Pregnancy    El primer trimestre de embarazo se extiende desde la semana1 hasta el final de la semana13 (mes1 al mes3). Durante este tiempo, el beb comenzar a desarrollarse dentro suyo. Entre la semana6 y la8, se forman los ojos y el rostro, y los latidos del corazn pueden escucharse en la ecografa. Al final de las 12semanas, todos los rganos del beb estn formados. El cuidado prenatal es toda la asistencia mdica que usted recibe antes del nacimiento del beb. Asegrese de recibir un buen cuidado prenatal y de seguir todas las indicaciones del mdico.  Siga estas indicaciones en su casa:  Medicamentos   Tome los medicamentos de venta libre y los recetados solamente como se lo haya indicado el mdico. Algunos medicamentos son seguros para tomar durante el embarazo y otros no lo son.   Tome vitaminas prenatales que contengan por lo menos 600microgramos (?g) de cido flico.   Si tiene problemas para defecar (estreimiento), tome un medicamento que ablanda la materia fecal (laxante), siempre que lo autorice el mdico.  Comida y bebida     Ingiera alimentos saludables de manera regular.   El mdico le indicar la cantidad de peso que puede aumentar.   No coma carne cruda ni quesos sin cocinar.   Si tiene malestar estomacal (nuseas) o vomita (vmitos):  ? Ingiera 4 o 5comidas pequeas por da en lugar de 3abundantes.  ? Intente comer algunas galletitas saladas.  ? Beba lquidos entre las comidas, en lugar de hacerlo durante estas.   Para evitar el estreimiento:  ? Consuma alimentos ricos en fibra, como frutas y verduras frescas, cereales integrales y legumbres.  ? Beba suficiente lquido para mantener el pis (orina) claro o de color amarillo plido.  Actividad   Haga ejercicios solamente como se lo haya indicado el mdico. Deje de hacer ejercicios si tiene clicos o dolor en la parte baja del vientre (abdomen) o en la cintura.   No haga  actividad fsica si el clima est demasiado caluroso o hmedo, o si se encuentra en un lugar muy alto (altitud elevada).   Intente no estar de pie durante mucho tiempo. Mueva las piernas con frecuencia si debe estar de pie en un lugar durante mucho tiempo.   Evite levantar pesos excesivos.   Use zapatos con tacones bajos. Mantenga una buena postura al sentarse y pararse.   Puede tener relaciones sexuales, a menos que el mdico le indique lo contrario.  Alivio del dolor y del malestar   Use un sostn que le brinde buen soporte si le duelen las mamas.   Dese baos de asiento con agua tibia para aliviar el dolor o las molestias causadas por las hemorroides. Use una crema antihemorroidal si el mdico se lo permite.   Descanse con las piernas elevadas si tiene calambres o dolor de cintura.   Si tiene las venas de las piernas hinchadas y abultadas (venas varicosas):  ? Use medias elsticas de soporte o medias de compresin como se lo haya indicado el mdico.  ? Levante (eleve) los pies durante 15minutos, 3 o 4veces por da.  ? Limite la sal en sus alimentos.  Cuidado prenatal   Programe las visitas prenatales para la semana12 de embarazo.   Escriba sus preguntas. Llvelas cuando concurra a las visitas prenatales.   Concurra a todas las visitas prenatales como se lo haya indicado el mdico. Esto es importante.  Seguridad   Use el cinturn de seguridad en todo   momento mientras conduce.   Haga una lista con los nmeros de telfono en caso de emergencia. Esta lista debe incluir los nmeros de los familiares, los amigos, el hospital y los departamentos de polica y de bomberos.  Instrucciones generales   Pdale al mdico que la derive a clases prenatales en su localidad. Debe comenzar a tomar las clases antes de entrar en el mes6 de embarazo.   Pida ayuda si necesita asesoramiento o asistencia con la alimentacin. El mdico puede aconsejarla o indicarle dnde recurrir para recibir ayuda.   No se d baos de  inmersin en agua caliente, baos turcos ni saunas.   No se haga duchas vaginales ni use tampones o toallas higinicas perfumadas.   No mantenga las piernas cruzadas durante mucho tiempo.   Evite las hierbas y el alcohol. Evite los frmacos que el mdico no haya autorizado.   No consuma ningn producto que contenga tabaco, lo que incluye cigarrillos, tabaco de mascar o cigarrillos electrnicos. Si necesita ayuda para dejar de fumar, consulte al mdico. Puede recibir asesoramiento u otro tipo de apoyo para dejar de fumar.   Evite el contacto con las bandejas sanitarias de los gatos y la tierra que estos animales usan. Estos elementos contienen grmenes que pueden causar defectos congnitos al beb y la posible prdida del feto (aborto espontneo) o muerte fetal.   Visite al dentista. En su casa, lvese los dientes con un cepillo dental suave. Psese el hilo dental con suavidad.  Comunquese con un mdico si:   Tiene mareos.   Tiene clicos leves o siente presin en la parte baja del vientre.   Sufre un dolor persistente en el abdomen.   Sigue teniendo malestar estomacal, vomita o la materia fecal es lquida (diarrea).   Nota una secrecin de lquido con olor ftido que proviene de la vagina.   Tiene dolor al hacer pis (orinar).   Tiene el rostro, las manos, las piernas o los tobillos ms hinchados (inflamados).  Solicite ayuda de inmediato si:   Tiene fiebre.   Tiene una prdida de lquido por la vagina.   Tiene sangrado o pequeas prdidas vaginales.   Tiene clicos o dolor muy intensos en el vientre.   Sube o baja de peso rpidamente.   Vomita sangre. Esto tiene un aspecto similar a la borra del caf.   Est en contacto con personas que tienen rubola, la quinta enfermedad o varicela.   Siente un dolor de cabeza muy intenso.   Le falta el aire.   Sufre cualquier tipo de traumatismo, por ejemplo, debido a una cada o un accidente automovilstico.  Resumen   El primer trimestre de embarazo se  extiende desde la semana1 hasta el final de la semana13 (mes1 al mes3).   Para cuidar su salud y la del beb en gestacin, necesitar consumir alimentos saludables, tomar medicamentos solamente si lo autoriza el mdico, y hacer actividades que sean seguras para usted y para su beb.   Concurra a todas las visitas de control como se lo haya indicado el mdico. Esto es importante porque el mdico deber asegurar que el beb est saludable y est creciendo bien.  Esta informacin no tiene como fin reemplazar el consejo del mdico. Asegrese de hacerle al mdico cualquier pregunta que tenga.  Document Released: 04/15/2008 Document Revised: 08/23/2016 Document Reviewed: 08/23/2016  Elsevier Interactive Patient Education  2019 Elsevier Inc.

## 2018-07-15 NOTE — MAU Note (Signed)
Pt here for repeat labs. Still having some bleeding. No other complications. Waiting in lobby for labs

## 2018-07-15 NOTE — MAU Provider Note (Signed)
Ms. Ren Grasse  is a 33 y.o. G3P2002 at [redacted]w[redacted]d who presents to MAU today for follow-up quant hCG after 48 hours. The patient was initially seen in MAU with vaginal bleeding and abdominal pain on 6/10. US showed a small IUGS and hCG was 19,377. She returned on 6/12 for repeat hCG and it was 17,066. She states today she continues to have bleeding with minimal pain. She denies fever.   BP 121/65 (BP Location: Right Arm)   Pulse 65   Resp 16   LMP 04/01/2018   CONSTITUTIONAL: Well-developed, well-nourished female in no acute distress.  MUSCULOSKELETAL: Normal range of motion.  CARDIOVASCULAR: Regular heart rate RESPIRATORY: Normal effort NEUROLOGICAL: Alert and oriented to person, place, and time.  SKIN: Skin is warm and dry. No rash noted. Not diaphoretic. No erythema. No pallor. PSYCH: Normal mood and affect. Normal behavior. Normal judgment and thought content.  Results for DESHANTA, LADY (MRN [id number]) as of 07/15/2018 15:57  Ref. Range 07/11/2018 13:48 07/11/2018 14:25 07/13/2018 14:59 07/15/2018 13:45  HCG, Beta Chain, Quant, S Latest Ref Range: <5 mIU/mL 19,377 (H)  17,066 (H) 13,317 (H)   MDM Discussed patient with Dr. Elonda Husky. Suggests patient have repeat hCG on Wednesday of this week as this is likely SAB given nothing seen in the adnexa with hCG level of 19,377.   A: Pregnancy of unknown location Declining hCG   P: Discharge home Strict ectopic precautions discussed Patient will [facility] at [facility] for repeat labs 07/18/18 Patient may return to MAU as needed or if her condition were to change or worsen   Luvenia Redden, PA-C 07/15/2018 4:05 PM

## 2018-07-17 ENCOUNTER — Telehealth: Payer: Self-pay | Admitting: Family Medicine

## 2018-07-17 NOTE — Telephone Encounter (Signed)
Attempted to call patient about her appointment on 6/17 @ 9:00. Patient would answer then hang up x3. Unable to reach patient about wearing a face mask and no visitors.

## 2018-07-18 ENCOUNTER — Ambulatory Visit (INDEPENDENT_AMBULATORY_CARE_PROVIDER_SITE_OTHER): Payer: Self-pay | Admitting: Obstetrics and Gynecology

## 2018-07-18 ENCOUNTER — Encounter: Payer: Self-pay | Admitting: Obstetrics and Gynecology

## 2018-07-18 DIAGNOSIS — Z20828 Contact with and (suspected) exposure to other viral communicable diseases: Secondary | ICD-10-CM

## 2018-07-18 DIAGNOSIS — Z789 Other specified health status: Secondary | ICD-10-CM

## 2018-07-18 DIAGNOSIS — O3680X0 Pregnancy with inconclusive fetal viability, not applicable or unspecified: Secondary | ICD-10-CM

## 2018-07-18 DIAGNOSIS — Z20822 Contact with and (suspected) exposure to covid-19: Secondary | ICD-10-CM

## 2018-07-18 LAB — BETA HCG QUANT (REF LAB): hCG Quant: 6391 m[IU]/mL

## 2018-07-18 MED ORDER — MISOPROSTOL 200 MCG PO TABS: 800.0000 ug | ORAL_TABLET | Freq: Once | ORAL | 0 refills | Status: DC

## 2018-07-18 NOTE — Progress Notes (Signed)
Results of Stat BHCG received @ 1020 via phone - [phone],391. Results reported to Dr. Rosana Hoes @ 1055 who reviewed pt chart. She advised that pt has a failed pregnancy and she will call pt to discuss results.

## 2018-07-18 NOTE — Progress Notes (Signed)
   TELEHEALTH VIRTUAL GYNECOLOGY VISIT ENCOUNTER NOTE  I connected with Tracie [name] on 07/18/18 at  9:00 AM EDT by telephone at home and verified that I am speaking with the correct person using two identifiers.   I discussed the limitations, risks, security and privacy concerns of performing an evaluation and management service by telephone and the availability of in person appointments. I also discussed with the patient that there may be a patient responsible charge related to this service. The patient expressed understanding and agreed to proceed.   History:  Tracie [name] is a 33 y.o. G20P2002 female being evaluated today for stat HCG after SAB diagnosed in MAU. She had early probable gestational sac with decreasing HCG. Today HCG is  6300, down from peak of [address]. She reports she had worsening cramping yesterday, now improved. Has heavy bleeding, reports going through 3-4 pads per day yesterday and today, bleeding the same today as yesterday. Denies other concerns.    Past Medical History:  Diagnosis Date  . Medical history non-contributory    Past Surgical History:  Procedure Laterality Date  . NO PAST SURGERIES     The following portions of the patient's history were reviewed and updated as appropriate: allergies, current medications, past family history, past medical history, past social history, past surgical history and problem list.    Review of Systems:  Pertinent items noted in HPI and remainder of comprehensive ROS otherwise negative.  Physical Exam:   General:  Alert, oriented and cooperative.   Mental Status: Normal mood and affect perceived. Normal judgment and thought content.  Physical exam deferred due to nature of the encounter  Labs and Imaging  Assessment and Plan:   1. Pregnancy, location unknown - Beta hCG quant (ref lab) - presumable IUP as gestational sac seen on [place], HCG decreasing appropriately - with increased bleeding the last few days - reviewed  options including expectant management versus medical management of incomplete SAB, she requests medical management. Reviewed risks/benefits of cytotec management, she is agreeable. Reviewed importance of presenting to MAU with increased pain/bleeding, she verbalizes understanding. Cytotec 800 mcg sent to pharmacy.  - repeat HCG 1 week  2. Exposure to Covid-19 Virus Reviewed importance of quarantining and exposing others, she verbalizes understanding  3. Language barrier Patent attorney used    I discussed the assessment and treatment plan with the patient. The patient was provided an opportunity to ask questions and all were answered. The patient agreed with the plan and demonstrated an understanding of the instructions.   The patient was advised to call back or seek an in-person evaluation/go to the ED if the symptoms worsen or if the condition fails to improve as anticipated.  I provided 15 minutes of non-face-to-face time during this encounter.   Sloan Leiter, MD [facility] [place]

## 2018-07-18 NOTE — Progress Notes (Signed)
Pt presents for STAT bhcg.  Pt states she is experiencing bilateral lower abdominal pain and increased vaginal bleeding since yesterday.  Pt advised that the lab takes 1-2 hours to result and she will be contacted regarding those results. Pt verbalized understanding.  Eda Royal used to interpret.  Pt states her husband has had a recent exposure to [place] virus.

## 2018-07-24 ENCOUNTER — Other Ambulatory Visit: Payer: Self-pay | Admitting: General Practice

## 2018-07-24 DIAGNOSIS — O039 Complete or unspecified spontaneous abortion without complication: Secondary | ICD-10-CM

## 2018-07-25 ENCOUNTER — Other Ambulatory Visit: Payer: Self-pay

## 2018-07-25 DIAGNOSIS — O039 Complete or unspecified spontaneous abortion without complication: Secondary | ICD-10-CM

## 2018-07-26 LAB — BETA HCG QUANT (REF LAB): hCG Quant: 160 m[IU]/mL

## 2018-07-31 ENCOUNTER — Telehealth: Payer: Self-pay

## 2018-07-31 DIAGNOSIS — O039 Complete or unspecified spontaneous abortion without complication: Secondary | ICD-10-CM

## 2018-07-31 NOTE — Telephone Encounter (Addendum)
-----   Message from Sloan Leiter, MD sent at 07/29/2018 10:26 PM EDT ----- HCG decreasing after SAB. No need for further follow up unless she is having issues.  Notified pt of provider's recommendation.  Pt stated that she will be able to come in on 08/07/18 @ 0930 for non-stat beta to trend to zero.  [place] office notified.

## 2018-08-07 ENCOUNTER — Other Ambulatory Visit: Payer: Self-pay

## 2018-08-07 DIAGNOSIS — O039 Complete or unspecified spontaneous abortion without complication: Secondary | ICD-10-CM

## 2018-08-08 LAB — BETA HCG QUANT (REF LAB): hCG Quant: 4 m[IU]/mL

## 2018-10-31 ENCOUNTER — Telehealth: Payer: Self-pay | Admitting: *Deleted

## 2018-10-31 NOTE — Telephone Encounter (Signed)
Tracie [name] called and wants to know if we can see her today because she is pregnant and is having severe pain , denies bleeding. Per chart miscarriage in June. Instructed her to go to [place] for evaluation. She voices understanding.  Telephone call interpreted by [facility]. Jacques Navy

## 2018-11-29 ENCOUNTER — Other Ambulatory Visit (HOSPITAL_COMMUNITY): Payer: Self-pay | Admitting: Family

## 2018-11-29 DIAGNOSIS — Z3682 Encounter for antenatal screening for nuchal translucency: Secondary | ICD-10-CM

## 2018-11-29 DIAGNOSIS — Z3A12 12 weeks gestation of pregnancy: Secondary | ICD-10-CM

## 2018-12-14 ENCOUNTER — Encounter (HOSPITAL_COMMUNITY): Payer: Self-pay | Admitting: *Deleted

## 2018-12-19 ENCOUNTER — Ambulatory Visit (HOSPITAL_COMMUNITY): Payer: Self-pay

## 2018-12-19 ENCOUNTER — Other Ambulatory Visit: Payer: Self-pay

## 2018-12-19 ENCOUNTER — Ambulatory Visit (HOSPITAL_COMMUNITY): Payer: Self-pay | Admitting: *Deleted

## 2018-12-19 ENCOUNTER — Ambulatory Visit (HOSPITAL_COMMUNITY)
Admission: RE | Admit: 2018-12-19 | Discharge: 2018-12-19 | Disposition: A | Discharge status: Home / Self Care | Payer: Self-pay | Source: Ambulatory Visit | Attending: Family | Admitting: Family

## 2018-12-19 ENCOUNTER — Encounter (HOSPITAL_COMMUNITY): Payer: Self-pay

## 2018-12-19 VITALS — BP 123/84 | HR 97 | Temp 98.5°F | Ht 62.0 in | Wt 159.4 lb

## 2018-12-19 DIAGNOSIS — Z3682 Encounter for antenatal screening for nuchal translucency: Secondary | ICD-10-CM | POA: Insufficient documentation

## 2018-12-19 DIAGNOSIS — O359XX0 Maternal care for (suspected) fetal abnormality and damage, unspecified, not applicable or unspecified: Secondary | ICD-10-CM

## 2018-12-19 DIAGNOSIS — Z3A12 12 weeks gestation of pregnancy: Secondary | ICD-10-CM | POA: Insufficient documentation

## 2018-12-19 NOTE — Progress Notes (Signed)
Pt reports coffee colored discharge yesterday.

## 2018-12-21 LAB — FIRST TRIMESTER SCREEN W/NT
CRL: 65.9 mm
DIA MoM: 1.26
DIA Value: 274 pg/mL
Gest Age-Collect: 12.7 weeks
Maternal Age At EDD: 34.6 yr
Nuchal Translucency MoM: 1.09
Nuchal Translucency: 1.9 mm
Number of Fetuses: 1
PAPP-A MoM: 1.4
PAPP-A Value: 1308.6 ng/mL
Test Results:: NEGATIVE
Weight: 159 [lb_av]
hCG MoM: 1.67
hCG Value: 142.5 IU/mL

## 2018-12-24 ENCOUNTER — Telehealth (HOSPITAL_COMMUNITY): Payer: Self-pay | Admitting: Genetic Counselor

## 2018-12-24 NOTE — Telephone Encounter (Signed)
LVM for Ms. Roma with the help of [place] ID# [phone] re: good news about screening results. Requested a call back to my direct line to discuss these in more detail, as no identifiers were provided in voicemail message.   Buelah Manis, MS Genetic Counselor

## 2018-12-26 ENCOUNTER — Telehealth (HOSPITAL_COMMUNITY): Payer: Self-pay | Admitting: Genetic Counselor

## 2018-12-26 NOTE — Telephone Encounter (Signed)
LVM for Ms. Youngberg for the second time re: good news about screening results with the help of [place] ID# [phone]. Requested a call back to my direct line to discuss these in more detail, as no identifiers were provided in voicemail message.   Buelah Manis, MS Genetic Counselor

## 2019-02-01 NOTE — L&D Delivery Note (Signed)
Delivery Note Arrived in MAU at 9cm.  Started laboring at 6:30am.  At 7:51 AM a viable and healthy female was delivered via Vaginal, Spontaneous (Presentation:   Occiput Anterior).  APGAR: 9, 9; weight  .   Placenta status: Spontaneous, Intact.  Cord: 3 vessels with the following complications: None.    Anesthesia: None Episiotomy: None Lacerations: None Suture Repair: none Est. Blood Loss (mL):  150  Mom to postpartum.  Baby to Couplet care / Skin to Skin.  Wynelle Bourgeois 07/01/2019, 8:03 AM

## 2019-06-28 ENCOUNTER — Telehealth (HOSPITAL_COMMUNITY): Payer: Self-pay | Admitting: *Deleted

## 2019-06-28 NOTE — Telephone Encounter (Signed)
Interpreter number [phone] Preadmission screen

## 2019-06-30 ENCOUNTER — Other Ambulatory Visit: Payer: Self-pay

## 2019-06-30 ENCOUNTER — Encounter (HOSPITAL_COMMUNITY): Payer: Self-pay | Admitting: Obstetrics and Gynecology

## 2019-06-30 ENCOUNTER — Inpatient Hospital Stay (HOSPITAL_COMMUNITY)
Admission: AD | Admit: 2019-06-30 | Discharge: 2019-07-01 | Disposition: A | Discharge status: Home / Self Care | Payer: Self-pay | Attending: Obstetrics and Gynecology | Admitting: Obstetrics and Gynecology

## 2019-06-30 DIAGNOSIS — O479 False labor, unspecified: Secondary | ICD-10-CM

## 2019-06-30 DIAGNOSIS — Z3A40 40 weeks gestation of pregnancy: Secondary | ICD-10-CM | POA: Insufficient documentation

## 2019-06-30 DIAGNOSIS — O471 False labor at or after 37 completed weeks of gestation: Secondary | ICD-10-CM | POA: Insufficient documentation

## 2019-06-30 NOTE — MAU Note (Signed)
Pt reports to MAU for contractions that are 10-15 mins apart, denies LOF, reports small amount of VB, +FM.

## 2019-07-01 ENCOUNTER — Inpatient Hospital Stay (HOSPITAL_COMMUNITY)
Admission: AD | Admit: 2019-07-01 | Discharge: 2019-07-03 | DRG: 807 | Disposition: A | Discharge status: Home / Self Care | Payer: Medicaid Other | Attending: Obstetrics and Gynecology | Admitting: Obstetrics and Gynecology

## 2019-07-01 ENCOUNTER — Other Ambulatory Visit: Payer: Self-pay

## 2019-07-01 ENCOUNTER — Encounter (HOSPITAL_COMMUNITY): Payer: Self-pay | Admitting: Obstetrics and Gynecology

## 2019-07-01 DIAGNOSIS — O479 False labor, unspecified: Secondary | ICD-10-CM

## 2019-07-01 DIAGNOSIS — Z20822 Contact with and (suspected) exposure to covid-19: Secondary | ICD-10-CM | POA: Diagnosis present

## 2019-07-01 DIAGNOSIS — O26893 Other specified pregnancy related conditions, third trimester: Secondary | ICD-10-CM | POA: Diagnosis present

## 2019-07-01 DIAGNOSIS — Z3A40 40 weeks gestation of pregnancy: Secondary | ICD-10-CM

## 2019-07-01 LAB — CBC
HCT: 41.2 % (ref 36.0–46.0)
Hemoglobin: 13.9 g/dL (ref 12.0–15.0)
MCH: 31.2 pg (ref 26.0–34.0)
MCHC: 33.7 g/dL (ref 30.0–36.0)
MCV: 92.6 fL (ref 80.0–100.0)
Platelets: 148 10*3/uL — ABNORMAL LOW (ref 150–400)
RBC: 4.45 MIL/uL (ref 3.87–5.11)
RDW: 13.4 % (ref 11.5–15.5)
WBC: 21.5 10*3/uL — ABNORMAL HIGH (ref 4.0–10.5)
nRBC: 0 % (ref 0.0–0.2)

## 2019-07-01 LAB — RPR: RPR Ser Ql: NONREACTIVE

## 2019-07-01 LAB — SARS CORONAVIRUS 2 BY RT PCR (HOSPITAL ORDER, PERFORMED IN ~~LOC~~ HOSPITAL LAB): SARS Coronavirus 2: NEGATIVE

## 2019-07-01 MED ORDER — TETANUS-DIPHTH-ACELL PERTUSSIS 5-2.5-18.5 LF-MCG/0.5 IM SUSP: 0.5000 mL | Freq: Once | INTRAMUSCULAR | Status: DC

## 2019-07-01 MED ORDER — WITCH HAZEL-GLYCERIN EX PADS: 1.0000 "application " | MEDICATED_PAD | CUTANEOUS | Status: DC | PRN

## 2019-07-01 MED ORDER — BENZOCAINE-MENTHOL 20-0.5 % EX AERO: 1.0000 "application " | INHALATION_SPRAY | CUTANEOUS | Status: DC | PRN

## 2019-07-01 MED ORDER — LACTATED RINGERS IV SOLN: INTRAVENOUS | Status: DC

## 2019-07-01 MED ORDER — OXYTOCIN BOLUS FROM INFUSION: 500.0000 mL | Freq: Once | INTRAVENOUS | Status: DC

## 2019-07-01 MED ORDER — ACETAMINOPHEN 325 MG PO TABS
650.0000 mg | ORAL_TABLET | ORAL | Status: DC | PRN
  Administered 2019-07-01: 650 mg via ORAL

## 2019-07-01 MED ORDER — OXYTOCIN 40 UNITS IN NORMAL SALINE INFUSION - SIMPLE MED: 2.5000 [IU]/h | INTRAVENOUS | Status: DC

## 2019-07-01 MED ORDER — FLEET ENEMA 7-19 GM/118ML RE ENEM: 1.0000 | ENEMA | RECTAL | Status: DC | PRN

## 2019-07-01 MED ORDER — OXYTOCIN 10 UNIT/ML IJ SOLN
10.0000 [IU] | Freq: Once | INTRAMUSCULAR | Status: AC
  Administered 2019-07-01: 10 [IU] via INTRAMUSCULAR

## 2019-07-01 MED ORDER — OXYCODONE-ACETAMINOPHEN 5-325 MG PO TABS: 2.0000 | ORAL_TABLET | ORAL | Status: DC | PRN

## 2019-07-01 MED ORDER — ONDANSETRON HCL 4 MG/2ML IJ SOLN: 4.0000 mg | INTRAMUSCULAR | Status: DC | PRN

## 2019-07-01 MED ORDER — LIDOCAINE HCL (PF) 1 % IJ SOLN: 30.0000 mL | INTRAMUSCULAR | Status: DC | PRN

## 2019-07-01 MED ORDER — ACETAMINOPHEN 325 MG PO TABS: 650.0000 mg | ORAL_TABLET | ORAL | Status: DC | PRN

## 2019-07-01 MED ORDER — DIBUCAINE (PERIANAL) 1 % EX OINT: 1.0000 "application " | TOPICAL_OINTMENT | CUTANEOUS | Status: DC | PRN

## 2019-07-01 MED ORDER — DIPHENHYDRAMINE HCL 25 MG PO CAPS: 25.0000 mg | ORAL_CAPSULE | Freq: Four times a day (QID) | ORAL | Status: DC | PRN

## 2019-07-01 MED ORDER — SIMETHICONE 80 MG PO CHEW: 80.0000 mg | CHEWABLE_TABLET | ORAL | Status: DC | PRN

## 2019-07-01 MED ORDER — IBUPROFEN 600 MG PO TABS
600.0000 mg | ORAL_TABLET | Freq: Four times a day (QID) | ORAL | Status: DC
  Administered 2019-07-01 – 2019-07-03 (×9): 600 mg via ORAL
  Filled 2019-07-01 (×9): qty 1

## 2019-07-01 MED ORDER — ONDANSETRON HCL 4 MG/2ML IJ SOLN: 4.0000 mg | Freq: Four times a day (QID) | INTRAMUSCULAR | Status: DC | PRN

## 2019-07-01 MED ORDER — SENNOSIDES-DOCUSATE SODIUM 8.6-50 MG PO TABS
2.0000 | ORAL_TABLET | ORAL | Status: DC
  Administered 2019-07-01 – 2019-07-02 (×2): 2 via ORAL
  Filled 2019-07-01 (×2): qty 2

## 2019-07-01 MED ORDER — COCONUT OIL OIL
1.0000 "application " | TOPICAL_OIL | Status: DC | PRN
  Administered 2019-07-02: 1 via TOPICAL

## 2019-07-01 MED ORDER — SOD CITRATE-CITRIC ACID 500-334 MG/5ML PO SOLN: 30.0000 mL | ORAL | Status: DC | PRN

## 2019-07-01 MED ORDER — OXYCODONE-ACETAMINOPHEN 5-325 MG PO TABS: 1.0000 | ORAL_TABLET | ORAL | Status: DC | PRN

## 2019-07-01 MED ORDER — LACTATED RINGERS IV SOLN: 500.0000 mL | INTRAVENOUS | Status: DC | PRN

## 2019-07-01 MED ORDER — OXYTOCIN 10 UNIT/ML IJ SOLN
INTRAMUSCULAR | Status: AC
  Filled 2019-07-01: qty 1

## 2019-07-01 MED ORDER — ZOLPIDEM TARTRATE 5 MG PO TABS: 5.0000 mg | ORAL_TABLET | Freq: Every evening | ORAL | Status: DC | PRN

## 2019-07-01 MED ORDER — ACETAMINOPHEN 325 MG PO TABS
650.0000 mg | ORAL_TABLET | ORAL | Status: DC | PRN
  Filled 2019-07-01: qty 2

## 2019-07-01 MED ORDER — PRENATAL MULTIVITAMIN CH
1.0000 | ORAL_TABLET | Freq: Every day | ORAL | Status: DC
  Administered 2019-07-01 – 2019-07-03 (×3): 1 via ORAL
  Filled 2019-07-01 (×3): qty 1

## 2019-07-01 MED ORDER — ONDANSETRON HCL 4 MG PO TABS: 4.0000 mg | ORAL_TABLET | ORAL | Status: DC | PRN

## 2019-07-01 NOTE — H&P (Addendum)
OBSTETRIC ADMISSION HISTORY AND PHYSICAL  Tracie [name] is a 34 y.o. female [phone] with IUP at [redacted]w[redacted]d by LMP/12 wk [place] presenting for SOL. She reports +FMs, No LOF, no VB, no blurry vision, headaches or peripheral edema, and RUQ pain.  She plans on breast feeding. She request nexplanon for birth control. She received her prenatal care at [facility]   Dating: By LMP/12 wk [place] --->  Estimated Date of Delivery: 06/27/19  Sono:  @[redacted]w[redacted]d , normal anatomy   Prenatal History/Complications: None  Past Medical History: Past Medical History:  Diagnosis Date   Medical history non-contributory     Past Surgical History: Past Surgical History:  Procedure Laterality Date   NO PAST SURGERIES      Obstetrical History: OB History     Gravida  4   Para  3   Term  3   Preterm      AB  1   Living  3      SAB  1   TAB      Ectopic      Multiple  0   Live Births  3           Social History: Social History   Socioeconomic History   Marital status: Significant Other    Spouse name: Not on file   Number of children: Not on file   Years of education: Not on file   Highest education level: Not on file  Occupational History   Not on file  Tobacco Use   Smoking status: Never Smoker   Smokeless tobacco: Never Used  Substance and Sexual Activity   Alcohol use: No   Drug use: No   Sexual activity: Yes    Birth control/protection: None  Other Topics Concern   Not on file  Social History Narrative   Not on file   Social Determinants of Health   Financial Resource Strain:    Difficulty of Paying Living Expenses:   Food Insecurity:    Worried About in the Last Year:    Programme researcher, broadcasting/film/video in the Last Year:   Transportation Needs:    Barista (Medical):    Lack of Transportation (Non-Medical):   Physical Activity:    Days of Exercise per Week:    Minutes of Exercise per Session:   Stress:    Feeling of Stress :   Social Connections:     Frequency of Communication with Friends and Family:    Frequency of Social Gatherings with Friends and Family:    Attends Religious Services:    Active Member of Clubs or Organizations:    Attends Freight forwarder:    Marital Status:     Family History: Family History  Problem Relation Age of Onset   Diabetes Other    Diabetes Mother    Diabetes Paternal Aunt    Diabetes Paternal Grandmother     Allergies: Allergies  Allergen Reactions   Pork-Derived Products Hives    Medications Prior to Admission  Medication Sig Dispense Refill Last Dose   albuterol (PROVENTIL HFA;VENTOLIN HFA) 108 (90 Base) MCG/ACT inhaler Inhale 2 puffs into the lungs every 4 (four) hours as needed for wheezing or shortness of breath. (Patient not taking: Reported on 12/19/2018) 1 Inhaler 0    OVER THE COUNTER MEDICATION Reports using ear drops, but does not know the name      Prenatal Vit-Fe Fumarate-FA (MULTIVITAMIN-PRENATAL) 27-0.8 MG TABS tablet Take 1 tablet by  mouth daily at 12 noon.        Review of Systems   All systems reviewed and negative except as stated in HPI  Blood pressure 122/75, pulse 94, temperature 98.8 F (37.1 C), temperature source Oral, resp. rate 18, height 5\' 5"  (1.651 m), weight 88.5 kg, last menstrual period 09/20/2018, unknown if currently breastfeeding. General appearance: alert, cooperative and appears stated age Respiratory: breathing comfortably on room air Dilation: 10 Effacement (%): 100 Station: Plus 2 Exam by:: J.Cox, RN   Prenatal labs: ABO, Rh:  O Pos Antibody:  negative Rubella:  immune RPR:   non reactive HBsAg:   non reactive HIV: Non Reactive (06/10 1348)  GBS:   negative 1 hr Glucola positive, 3 hour GTT negative Genetic screening  Low fist Quad screen Anatomy US normal  Prenatal Transfer Tool  Maternal Diabetes: No Genetic Screening: Normal Maternal Ultrasounds/Referrals: Normal Fetal Ultrasounds or other Referrals:   None Maternal Substance Abuse:  No Significant Maternal Medications:  None Significant Maternal Lab Results: None  No results found for this or any previous visit (from the past 24 hour(s)).  Patient Active Problem List   Diagnosis Date Noted   Normal labor 07/01/2019   Uterine contractions during pregnancy 07/01/2019   Vaginal delivery 07/01/2019    Assessment/Plan:  Tracie [name] is a 34 y.o. G1W2993 at [redacted]w[redacted]d here for SOL.  #Labor: Active labor. Expectant management. #Pain: IV pain medication if IV access is possible #FWB: Cat I #ID:  GBS negative #MOF: breast #MOC: nexplanon #Circ:  no  Matilde Haymaker, MD  07/01/2019, 9:07 AM  Attestation:  I confirm that I have verified the information documented in the resident's note and that I have also personally reperformed the physical exam and all medical decision making activities.  The patient was seen and examined by me also Agree with note NST reactive and reassuring UCs as listed Cervical exams as listed in note  Seabron Spates, CNM

## 2019-07-01 NOTE — Progress Notes (Signed)
Performed cervical exam and discussed plan of care.

## 2019-07-01 NOTE — MAU Note (Signed)
3rd baby goes to [facility]. 3rd baby. 2 prior vag del.

## 2019-07-01 NOTE — Progress Notes (Signed)
Introduced self to pt and discussed plan of care

## 2019-07-01 NOTE — Discharge Summary (Signed)
Postpartum Discharge Summary  Date of Service updated6/1/21     Patient Name: Tracie [name] DOB: Mar 22, 1985 MRN: [id number]  Date of admission: 07/01/2019 Delivery date:07/01/2019  Delivering provider: Seabron Spates  Date of discharge: 07/02/2019  Admitting diagnosis: Normal labor [O80, Z37.9] Uterine contractions during pregnancy [O62.2] Intrauterine pregnancy: [redacted]w[redacted]d    Secondary diagnosis:  Active Problems:   Normal labor   Uterine contractions during pregnancy   Vaginal delivery  Additional problems: none     Discharge diagnosis: Term Pregnancy Delivered                                              Post partum procedures:none Augmentation: N/A Complications: None  Hospital course: Onset of Labor With Vaginal Delivery      34y.o. yo [id number] [id number] admitted in Active Labor on 07/01/2019. Patient had an uncomplicated labor course as follows:  Had been here several hours before for a labor eval and discharged home about 2am (1cm) Returned in full labor just before 8am and delivered quickly Membrane Rupture Time/Date: 7:47 AM ,07/01/2019   Delivery Method:Vaginal, Spontaneous  Episiotomy: None  Lacerations:  None  Patient had an uncomplicated postpartum course.  She is ambulating, tolerating a regular diet, passing flatus, and urinating well. Patient is discharged home in stable condition on 07/02/19.  Newborn Data: Birth date:07/01/2019  Birth time:7:51 AM  Gender:Female  Living status:Living  Apgars:9 ,9  Weight:3110 g   Magnesium Sulfate received: No BMZ received: No Rhophylac:N/A MMR:No T-DaP:Given prenatally Flu: N/A Transfusion:No  Physical exam  Vitals:   07/01/19 1500 07/01/19 1930 07/01/19 2348 07/02/19 0552  BP: 115/63 117/76 110/75 113/72  Pulse: 80 99 81 92  Resp: 18 18 18 18   Temp: 98.6 F (37 C) 98.4 F (36.9 C) 98.3 F (36.8 C) 98.2 F (36.8 C)  TempSrc: Oral Oral Oral Oral  SpO2: 99% 96% 97% 96%  Weight:      Height:        General: alert, cooperative and no distress Lochia: appropriate Uterine Fundus: firm Incision: N/A DVT Evaluation: No evidence of DVT seen on physical exam. Labs: Lab Results  Component Value Date   WBC 21.5 (H) 07/01/2019   HGB 13.9 07/01/2019   HCT 41.2 07/01/2019   MCV 92.6 07/01/2019   PLT 148 (L) 07/01/2019   No flowsheet data found. Edinburgh Score: No flowsheet data found.    After visit meds:  Allergies as of 07/02/2019      Reactions   Pork-derived Products Hives      Medication List    TAKE these medications   albuterol 108 (90 Base) MCG/ACT inhaler Commonly known as: VENTOLIN HFA Inhale 2 puffs into the lungs every 4 (four) hours as needed for wheezing or shortness of breath.   ibuprofen 600 MG tablet Commonly known as: ADVIL Take 1 tablet (600 mg total) by mouth every 6 (six) hours.   multivitamin-prenatal 27-0.8 MG Tabs tablet Take 1 tablet by mouth daily at 12 noon.   OVER THE COUNTER MEDICATION Reports using ear drops, but does not know the name        Discharge home in stable condition Infant Feeding: Breast Infant Disposition:home with mother Discharge instruction: per After Visit Summary and Postpartum booklet. Activity: Advance as tolerated. Pelvic rest for 6 weeks.  Diet: routine diet Anticipated Birth Control:  Nexplanon Postpartum Appointment:4 weeks Additional Postpartum F/U: none Future Appointments: No future appointments. Follow up Visit: [facility]      07/02/2019 Hansel Feinstein, CNM

## 2019-07-01 NOTE — Discharge Instructions (Signed)
Parto vaginal Vaginal Delivery  Parto vaginal significa que usted da a luz empujando al beb fuera del canal del parto (vagina). Un equipo de proveedores de atencin mdica la ayudar antes, durante y despus del parto vaginal. Las experiencias de los nacimientos son nicas para todas las mujeres, y cada embarazo y las experiencias de nacimiento varan segn dnde elija dar a luz. Qu ocurrir cuando llegue al centro de parto o al hospital? Una vez que se inicie el trabajo de parto y haya sido admitida en el hospital o centro de parto, el mdico podr hacer lo siguiente:  Revisar sus antecedentes de embarazo y cualquier inquietud que usted pueda tener.  Colocarle una va intravenosa en una de las venas. Esto se podr usar para administrarle lquidos y medicamentos.  Verificar su presin arterial, pulso, temperatura y frecuencia cardaca (signos vitales).  Verificar si la bolsa de agua (saco amnitico) se ha roto (ruptura).  Hablar con usted sobre su plan de nacimiento y analizar las opciones para controlar el dolor. Monitoreo Su mdico puede monitorear las contracciones (monitoreo uterino) y la frecuencia cardaca del beb (monitoreo fetal). Es posible que el monitoreo se necesite realizar:  Con frecuencia, pero no continuamente (intermitentemente).  Todo el tiempo o durante largos perodos a la vez (continuamente). El monitoreo continuo puede ser necesario si: ? Est recibiendo determinados medicamentos, tales como medicamentos para aliviar el dolor o para hacer que las contracciones sean ms fuertes. ? Tiene complicaciones durante el embarazo o el trabajo de parto. El monitoreo se puede realizar:  Al colocar un estetoscopio especial o un dispositivo manual de monitoreo en el abdomen o verificar los latidos cardacos del beb y comprobar las contracciones.  Al colocar monitores en el abdomen (monitores externos) para registrar los latidos cardacos del beb y la frecuencia y duracin de  las contracciones.  Al colocar monitores dentro del tero a travs de la vagina (monitores internos) para registrar los latidos cardacos del beb y la frecuencia, duracin y fuerza de sus contracciones. Segn el tipo de monitor, puede permanecer en el tero o en la cabeza del beb hasta el nacimiento.  Telemetra. Se trata de un tipo de monitoreo continuo que se puede realizar con monitores externos o internos. En lugar de tener que permanecer en la cama, usted puede moverse durante la telemetra. Examen fsico Su mdico puede realizar exmenes fsicos frecuentes. Esto puede incluir lo siguiente:  Verificar cmo y dnde el beb est ubicado en el tero.  Verificar el cuello uterino para determinar: ? Si se est afinando o estirando (borrando). ? Si se est abriendo (dilatando). Qu sucede durante el trabajo de parto y el parto?  El trabajo de parto y el parto normales se dividen en tres etapas: Etapa 1  Esta es la etapa ms larga del trabajo de parto.  Esta etapa puede durar horas o das.  Durante esta etapa, sentir contracciones. En general, las contracciones son leves, infrecuentes e irregulares al principio. Se hacen ms fuertes, ms frecuentes (aproximadamente cada 2 o 3 minutos) y ms regulares a medida que avanza en esta etapa.  Esta etapa finaliza cuando el cuello uterino est completamente dilatado hasta 4 pulgadas (10cm) y completamente borrado. Etapa 2  Esta etapa comienza una vez que el cuello uterino est totalmente borrado y dilatado, y dura hasta el nacimiento del beb.  Esta etapa puede durar de 20 minutos a 2 horas.  Esta es la etapa en la que va a sentir ganas de pujar al beb fuera de la   vagina.  Puede sentir un dolor urente y por estiramiento, especialmente cuando la parte ms ancha de la cabeza del beb pasa a travs de la abertura vaginal (coronacin).  Una vez que el beb nace, el cordn umbilical se pinzar y se cortar. Esto ocurre por lo general despus  de un perodo de 1 a 2 minutos despus del parto.  Colocarn al beb sobre su pecho desnudo (contacto piel con piel) en una posicin erguida y cubierto con una manta abrigada. Observe al beb para detectar seales de hambre, como el reflejo de bsqueda o succin, y acrquelo al pecho para su primera alimentacin. Etapa 3  Esta etapa comienza inmediatamente despus del nacimiento del beb y finaliza despus de la expulsin de la placenta.  Esta etapa puede durar de 5 a 30 minutos.  Despus del nacimiento del beb, puede sentir contracciones cuando el cuerpo expulsa la placenta y el tero se contrae para controlar la hemorragia. Qu puedo esperar despus del trabajo de parto y el parto?  Una vez que termine el trabajo de parto, se los controlar a usted y al beb atentamente para tener la seguridad de que ambos estn sanos y listos para ir a casa. Su equipo de atencin mdica le ensear cmo cuidarse y cuidar a su beb.  Usted y el beb permanecern en la misma habitacin (cohabitacin) durante su estada en el hospital. Esto estimular una vinculacin temprana y una lactancia exitosa.  Puede seguir recibiendo lquidos o medicamentos por va intravenosa.  Se le controlar y masajear el tero con regularidad (masaje fndico).  Tendr algo de inflamacin y dolor en el abdomen, la vagina y la zona de la piel entre la abertura vaginal y el ano (perineo).  Si se le realiz una incisin cerca de la vagina (episiotoma) o si ha tenido algn desgarro durante el parto, podran indicarle que se coloque compresas fras sobre la episiotoma o el desgarro. Esto ayuda a aliviar el dolor y la hinchazn.  Es posible que le den una botella rociadora para que use cuando vaya al bao para higienizarse. Siga los pasos a continuacin para usar la botella rociadora: ? Antes de orinar, llene la botella rociadora con agua tibia. No use agua caliente. ? Despus de orinar, mientras an est sentada en el inodoro,  use la botella rociadora para enjuagar el rea alrededor de la uretra y la abertura vaginal. Con esto podr limpiar cualquier rastro de orina y sangre. ? Llene la botella rociadora con agua limpia cada vez que vaya al bao.  Es normal tener hemorragia vaginal despus del parto. Use un apsito sanitario para el sangrado vaginal y secrecin. Resumen  Parto vaginal significa que usted dar a luz empujando al beb fuera del canal del parto (vagina).  Su mdico puede monitorear las contracciones (monitoreo uterino) y la frecuencia cardaca del beb (monitoreo fetal).  Su mdico puede realizarle un examen fsico.  El trabajo de parto y el parto normales se dividen en tres etapas.  Una vez que termina el trabajo de parto, se los controlar a usted y al beb atentamente hasta que estn listos para ir a casa. Esta informacin no tiene como fin reemplazar el consejo del mdico. Asegrese de hacerle al mdico cualquier pregunta que tenga. Document Revised: 03/29/2017 Document Reviewed: 03/29/2017 Elsevier Patient Education  2020 Elsevier Inc.  

## 2019-07-01 NOTE — MAU Provider Note (Signed)
S: [name] is a 34 y.o. [phone] at [redacted]w[redacted]d  who presents to MAU today for labor evaluation.     Cervical exam by RN:  Dilation: 1 Effacement (%): 50 Cervical Position: Posterior Station: -3 Presentation: Vertex Exam by:: Camelia Eng, RN No change after one hour  Fetal Monitoring: Baseline: 135 Variability: average Accelerations: present Decelerations: absent Contractions: irregular  MDM Discussed patient with RN. NST reviewed.   A: SIUP at [redacted]w[redacted]d  False labor  P: Discharge home Labor precautions and kick counts included in AVS Patient to follow-up with office as scheduled  Patient may return to MAU as needed or when in labor   Aviva Signs, [place] 07/01/2019 1:54 AM

## 2019-07-02 ENCOUNTER — Other Ambulatory Visit (HOSPITAL_COMMUNITY): Payer: Self-pay

## 2019-07-02 MED ORDER — IBUPROFEN 600 MG PO TABS: 600.0000 mg | ORAL_TABLET | Freq: Four times a day (QID) | ORAL | 0 refills | Status: DC

## 2019-07-02 NOTE — Lactation Note (Signed)
This note was copied from a baby's chart. Lactation Consultation Note Baby 48 hrs old. Mom hadn't seen LC. [name] is signed consent to be mom's interpreter for Spanish. He appears to understand [place] well. Encouraged if needed to ask for Interpreter.  Mom has been BF/formula/bottle feeding. Mom states very painful to BF most of the time. Mom has cracked nipples at base and bruising. Lt. Nipple has positional stripe.  Mom has very short shaft nipples. Baby is unable to obtain deep latch. Semi compressible. Mom can't tolerate reverse pressure d/t cracked nipples. Coconut oil given. Shells given to wear in am.  Mom shown how to use DEBP & how to disassemble, clean, & reassemble parts. Mom knows to pump q3h for 15-20 min. Mom encouraged to feed baby 8-12 times/24 hours and with feeding cues.  Baby is on DPT. Mom is also trying to BF as much as possible w/lights, demonstrated laying baby on lights, not wrapping baby in lights while BF so can obtain deeper latch.  Mom has been BF in cradle position. Demonstrated football hold. Latching very painful d/t cracked nipples. Fitted mom w/#20 NS. Has #24 NS at bedside if needed. Mom had brought NS. Unknown size. Looks large. Mom hasn't used hers. Baby needed much stimulation to keep feeding. Noted colostrum in NS. Discussed postioning, support, body alignment, cheeks to breast, and breast support.  Mom stated she couldn't latch her 35 yr old and 35 yr old to the breast.   Taught mom how to hand express. Colostrum pours. Collected a few drops. Mom pumping w/colostrum noted in bottle. Praised mom. Milk storage discussed. Emphasized not to mix BM w/formula, and to give BM first. Parents state understanding.  Newborn behavior and behavior w/hyperbilirubinemia. Discussed that baby can be sleepy and needs stimulated to keep feeding. Wake baby every 3 hrs if hasn't cued to feed.  Encouraged to call for questions or assistance. Mom has WIC. Lactation  brochure given.   Patient Name: Tracie [name] [id number] Date: 07/02/2019 Reason for consult: Initial assessment;1st time breastfeeding;Term;Hyperbilirubinemia   Maternal Data Has patient been taught Hand Expression?: Yes Does the patient have breastfeeding experience prior to this delivery?: No  Feeding Feeding Type: Formula Nipple Type: Slow - flow  LATCH Score Latch: Repeated attempts needed to sustain latch, nipple held in mouth throughout feeding, stimulation needed to elicit sucking reflex.  Audible Swallowing: A few with stimulation  Type of Nipple: Everted at rest and after stimulation(short shaft/)  Comfort (Breast/Nipple): Engorged, cracked, bleeding, large blisters, severe discomfort(Rt. nipple cracked at base/positional strip Lt. nipple, cracked base)  Hold (Positioning): Full assist, staff holds infant at breast  LATCH Score: 4  Interventions Interventions: Breast feeding basics reviewed;Support pillows;Assisted with latch;Position options;Skin to skin;Expressed milk;Breast massage;Coconut oil;Hand express;Shells;Pre-pump if needed;Hand pump;DEBP;Breast compression;Adjust position;Reverse pressure(reverse pressure to painful d/t cracking.)  Lactation Tools Discussed/Used Tools: Shells;Pump;Coconut oil;Nipple Shields Nipple shield size: 20 Shell Type: Inverted Breast pump type: Double-Electric Breast Pump;Manual WIC Program: Yes Pump Review: Setup, frequency, and cleaning;Milk Storage Initiated by:: Peri Jefferson RN IBCLC Date initiated:: 07/02/19   Consult Status Consult Status: Follow-up Date: 07/03/19 Follow-up type: In-patient    Tracie [name] 07/02/2019, 11:39 PM

## 2019-07-03 ENCOUNTER — Ambulatory Visit: Payer: Self-pay

## 2019-07-03 ENCOUNTER — Inpatient Hospital Stay (HOSPITAL_COMMUNITY): Payer: Medicaid Other

## 2019-07-03 ENCOUNTER — Inpatient Hospital Stay (HOSPITAL_COMMUNITY)
Admission: AD | Admit: 2019-07-03 | Payer: Medicaid Other | Source: Home / Self Care | Admitting: Obstetrics and Gynecology

## 2019-07-03 NOTE — Discharge Instructions (Signed)
Etonogestrel implant What is this medicine? ETONOGESTREL (et oh noe JES trel) is a contraceptive (birth control) device. It is used to prevent pregnancy. It can be used for up to 3 years. This medicine may be used for other purposes; ask your health care provider or pharmacist if you have questions. COMMON BRAND NAME(S): Implanon, Nexplanon What should I tell my health care provider before I take this medicine? They need to know if you have any of these conditions:  abnormal vaginal bleeding  blood vessel disease or blood clots  breast, cervical, endometrial, ovarian, liver, or uterine cancer  diabetes  gallbladder disease  heart disease or recent heart attack  high blood pressure  high cholesterol or triglycerides  kidney disease  liver disease  migraine headaches  seizures  stroke  tobacco smoker  an unusual or allergic reaction to etonogestrel, anesthetics or antiseptics, other medicines, foods, dyes, or preservatives  pregnant or trying to get pregnant  breast-feeding How should I use this medicine? This device is inserted just under the skin on the inner side of your upper arm by a health care professional. Talk to your pediatrician regarding the use of this medicine in children. Special care may be needed. Overdosage: If you think you have taken too much of this medicine contact a poison control center or emergency room at once. NOTE: This medicine is only for you. Do not share this medicine with others. What if I miss a dose? This does not apply. What may interact with this medicine? Do not take this medicine with any of the following medications:  amprenavir  fosamprenavir This medicine may also interact with the following medications:  acitretin  aprepitant  armodafinil  bexarotene  bosentan  carbamazepine  certain medicines for fungal infections like fluconazole, ketoconazole, itraconazole and voriconazole  certain medicines to treat  hepatitis, HIV or AIDS  cyclosporine  felbamate  griseofulvin  lamotrigine  modafinil  oxcarbazepine  phenobarbital  phenytoin  primidone  rifabutin  rifampin  rifapentine  St. John's wort  topiramate This list may not describe all possible interactions. Give your health care provider a list of all the medicines, herbs, non-prescription drugs, or dietary supplements you use. Also tell them if you smoke, drink alcohol, or use illegal drugs. Some items may interact with your medicine. What should I watch for while using this medicine? This product does not protect you against HIV infection (AIDS) or other sexually transmitted diseases. You should be able to feel the implant by pressing your fingertips over the skin where it was inserted. Contact your doctor if you cannot feel the implant, and use a non-hormonal birth control method (such as condoms) until your doctor confirms that the implant is in place. Contact your doctor if you think that the implant may have broken or become bent while in your arm. You will receive a user card from your health care provider after the implant is inserted. The card is a record of the location of the implant in your upper arm and when it should be removed. Keep this card with your health records. What side effects may I notice from receiving this medicine? Side effects that you should report to your doctor or health care professional as soon as possible:  allergic reactions like skin rash, itching or hives, swelling of the face, lips, or tongue  breast lumps, breast tissue changes, or discharge  breathing problems  changes in emotions or moods  coughing up blood  if you feel that the implant   may have broken or bent while in your arm  high blood pressure  pain, irritation, swelling, or bruising at the insertion site  scar at site of insertion  signs of infection at the insertion site such as fever, and skin redness, pain or  discharge  signs and symptoms of a blood clot such as breathing problems; changes in vision; chest pain; severe, sudden headache; pain, swelling, warmth in the leg; trouble speaking; sudden numbness or weakness of the face, arm or leg  signs and symptoms of liver injury like dark yellow or brown urine; general ill feeling or flu-like symptoms; light-colored stools; loss of appetite; nausea; right upper belly pain; unusually weak or tired; yellowing of the eyes or skin  unusual vaginal bleeding, discharge Side effects that usually do not require medical attention (report to your doctor or health care professional if they continue or are bothersome):  acne  breast pain or tenderness  headache  irregular menstrual bleeding  nausea This list may not describe all possible side effects. Call your doctor for medical advice about side effects. You may report side effects to FDA at 1-800-FDA-1088. Where should I keep my medicine? This drug is given in a hospital or clinic and will not be stored at home. NOTE: This sheet is a summary. It may not cover all possible information. If you have questions about this medicine, talk to your doctor, pharmacist, or health care provider.  2020 Elsevier/Gold Standard (2018-10-30 11:33:04) Postpartum Care After Vaginal Delivery This sheet gives you information about how to care for yourself from the time you deliver your baby to up to 6-12 weeks after delivery (postpartum period). Your health care provider may also give you more specific instructions. If you have problems or questions, contact your health care provider. Follow these instructions at home: Vaginal bleeding  It is normal to have vaginal bleeding (lochia) after delivery. Wear a sanitary pad for vaginal bleeding and discharge. ? During the first week after delivery, the amount and appearance of lochia is often similar to a menstrual period. ? Over the next few weeks, it will gradually decrease to  a dry, yellow-brown discharge. ? For most women, lochia stops completely by 4-6 weeks after delivery. Vaginal bleeding can vary from woman to woman.  Change your sanitary pads frequently. Watch for any changes in your flow, such as: ? A sudden increase in volume. ? A change in color. ? Large blood clots.  If you pass a blood clot from your vagina, save it and call your health care provider to discuss. Do not flush blood clots down the toilet before talking with your health care provider.  Do not use tampons or douches until your health care provider says this is safe.  If you are not breastfeeding, your period should return 6-8 weeks after delivery. If you are feeding your child breast milk only (exclusive breastfeeding), your period may not return until you stop breastfeeding. Perineal care  Keep the area between the vagina and the anus (perineum) clean and dry as told by your health care provider. Use medicated pads and pain-relieving sprays and creams as directed.  If you had a cut in the perineum (episiotomy) or a tear in the vagina, check the area for signs of infection until you are healed. Check for: ? More redness, swelling, or pain. ? Fluid or blood coming from the cut or tear. ? Warmth. ? Pus or a bad smell.  You may be given a squirt bottle to use instead  of wiping to clean the perineum area after you go to the bathroom. As you start healing, you may use the squirt bottle before wiping yourself. Make sure to wipe gently.  To relieve pain caused by an episiotomy, a tear in the vagina, or swollen veins in the anus (hemorrhoids), try taking a warm sitz bath 2-3 times a day. A sitz bath is a warm water bath that is taken while you are sitting down. The water should only come up to your hips and should cover your buttocks. Breast care  Within the first few days after delivery, your breasts may feel heavy, full, and uncomfortable (breast engorgement). Milk may also leak from your  breasts. Your health care provider can suggest ways to help relieve the discomfort. Breast engorgement should go away within a few days.  If you are breastfeeding: ? Wear a bra that supports your breasts and fits you well. ? Keep your nipples clean and dry. Apply creams and ointments as told by your health care provider. ? You may need to use breast pads to absorb milk that leaks from your breasts. ? You may have uterine contractions every time you breastfeed for up to several weeks after delivery. Uterine contractions help your uterus return to its normal size. ? If you have any problems with breastfeeding, work with your health care provider or Advertising copywriter.  If you are not breastfeeding: ? Avoid touching your breasts a lot. Doing this can make your breasts produce more milk. ? Wear a good-fitting bra and use cold packs to help with swelling. ? Do not squeeze out (express) milk. This causes you to make more milk. Intimacy and sexuality  Ask your health care provider when you can engage in sexual activity. This may depend on: ? Your risk of infection. ? How fast you are healing. ? Your comfort and desire to engage in sexual activity.  You are able to get pregnant after delivery, even if you have not had your period. If desired, talk with your health care provider about methods of birth control (contraception). Medicines  Take over-the-counter and prescription medicines only as told by your health care provider.  If you were prescribed an antibiotic medicine, take it as told by your health care provider. Do not stop taking the antibiotic even if you start to feel better. Activity  Gradually return to your normal activities as told by your health care provider. Ask your health care provider what activities are safe for you.  Rest as much as possible. Try to rest or take a nap while your baby is sleeping. Eating and drinking   Drink enough fluid to keep your urine pale  yellow.  Eat high-fiber foods every day. These may help prevent or relieve constipation. High-fiber foods include: ? Whole grain cereals and breads. ? Brown rice. ? Beans. ? Fresh fruits and vegetables.  Do not try to lose weight quickly by cutting back on calories.  Take your prenatal vitamins until your postpartum checkup or until your health care provider tells you it is okay to stop. Lifestyle  Do not use any products that contain nicotine or tobacco, such as cigarettes and e-cigarettes. If you need help quitting, ask your health care provider.  Do not drink alcohol, especially if you are breastfeeding. General instructions  Keep all follow-up visits for you and your baby as told by your health care provider. Most women visit their health care provider for a postpartum checkup within the first 3-6 weeks after  delivery. Contact a health care provider if:  You feel unable to cope with the changes that your child brings to your life, and these feelings do not go away.  You feel unusually sad or worried.  Your breasts become red, painful, or hard.  You have a fever.  You have trouble holding urine or keeping urine from leaking.  You have little or no interest in activities you used to enjoy.  You have not breastfed at all and you have not had a menstrual period for 12 weeks after delivery.  You have stopped breastfeeding and you have not had a menstrual period for 12 weeks after you stopped breastfeeding.  You have questions about caring for yourself or your baby.  You pass a blood clot from your vagina. Get help right away if:  You have chest pain.  You have difficulty breathing.  You have sudden, severe leg pain.  You have severe pain or cramping in your lower abdomen.  You bleed from your vagina so much that you fill more than one sanitary pad in one hour. Bleeding should not be heavier than your heaviest period.  You develop a severe headache.  You  faint.  You have blurred vision or spots in your vision.  You have bad-smelling vaginal discharge.  You have thoughts about hurting yourself or your baby. If you ever feel like you may hurt yourself or others, or have thoughts about taking your own life, get help right away. You can go to the nearest emergency department or call:  Your local emergency services (911 in the [place]  A suicide crisis helpline, such as the [facility] at 939-695-8781. This is open 24 hours a day. Summary  The period of time right after you deliver your newborn up to 6-12 weeks after delivery is called the postpartum period.  Gradually return to your normal activities as told by your health care provider.  Keep all follow-up visits for you and your baby as told by your health care provider. This information is not intended to replace advice given to you by your health care provider. Make sure you discuss any questions you have with your health care provider. Document Revised: 01/20/2017 Document Reviewed: 10/31/2016 Elsevier Patient Education  [place] vaginal Vaginal Delivery  Parto vaginal significa que usted da a luz empujando al beb fuera del canal del parto (vagina). Un equipo de proveedores de atencin mdica la ayudar antes, durante y despus del parto vaginal. Las experiencias de los nacimientos son nicas para todas las [place], y Engineer, technical sales y las experiencias de nacimiento varan segn dnde elija dar a luz. Sander Nephew ocurrir cuando llegue al centro de [place] o al hospital? [place] que se inicie el [place] de parto y haya sido admitida en el hospital o centro de parto, el mdico podr hacer lo siguiente:  Revisar sus antecedentes de Media planner y cualquier inquietud que usted pueda tener.  Colocarle una va intravenosa en una de las venas. Esto se podr usar para administrarle lquidos y medicamentos.  Verificar su presin arterial, pulso,  temperatura y frecuencia cardaca (signos vitales).  Verificar si la bolsa de agua (saco amnitico) se ha roto (ruptura).  Hablar con usted sobre su plan de nacimiento y Physiological scientist las opciones para Financial controller. Monitoreo Su mdico puede monitorear las contracciones (monitoreo uterino) y la frecuencia cardaca del beb (monitoreo fetal). Es posible que el monitoreo se necesite realizar:  Con frecuencia, pero no continuamente (  intermitentemente).  Todo el tiempo o durante largos perodos a la vez (continuamente). El monitoreo continuo puede ser necesario si: ? Est recibiendo determinados medicamentos, tales como medicamentos para Engineer, materials o para hacer que las contracciones sean ms fuertes. ? Tiene complicaciones durante el embarazo o el [place]. El monitoreo se puede realizar:  Al colocar un estetoscopio especial o un dispositivo manual de monitoreo en el abdomen o verificar los latidos cardacos del beb y comprobar las contracciones.  Al colocar monitores en el abdomen (monitores externos) para Passenger transport manager los latidos cardacos del beb y la frecuencia y duracin de las contracciones.  Al colocar monitores dentro del tero a travs de la vagina (monitores internos) para Passenger transport manager los latidos cardacos del beb y la frecuencia, duracin y fuerza de sus contracciones. Segn el tipo de monitor, Insurance claims handler en el tero o en la cabeza del beb hasta el nacimiento.  [employer]. Se trata de un tipo de monitoreo continuo que se puede Education officer, environmental con monitores externos o internos. En lugar de Hospital doctor en la cama, usted puede moverse durante Fish farm manager. Examen fsico Su mdico puede realizar exmenes fsicos frecuentes. Esto puede incluir lo siguiente:  Investment banker, operational cmo y dnde el beb est ubicado en el tero.  Verificar el cuello uterino para determinar: ? Si se est afinando o estirando (borrando). ? Si se est abriendo (dilatando). Qu sucede durante el  [place] de parto y [place]?  El [place] de parto y el parto normales se dividen en tres etapas: Etapa 1  Esta es la etapa ms larga del trabajo de [place].  Esta etapa puede durar [employer].  Durante esta etapa, sentir contracciones. En general, las contracciones son leves, infrecuentes e irregulares al principio. Se hacen ms fuertes, ms frecuentes (aproximadamente cada 2 o 3 minutos) y ms regulares a medida que avanza en esta etapa.  Esta etapa finaliza cuando el cuello uterino est completamente dilatado hasta 4 pulgadas (10cm) y completamente borrado. Etapa 2  Esta etapa comienza una vez que el cuello uterino est totalmente borrado y dilatado, y dura hasta el nacimiento del beb.  Esta etapa puede durar de 20 minutos a 2 horas.  Esta es la etapa en la que va a sentir ganas de pujar al beb fuera de la vagina.  Puede sentir un dolor urente y por estiramiento, especialmente cuando la parte ms ancha de la cabeza del beb pasa a travs de la abertura vaginal (coronacin).  Una vez que el beb nace, el cordn umbilical se pinzar y se cortar. Esto ocurre por lo general despus de un perodo de 1 a 2 minutos despus del parto.  Colocarn al beb sobre su pecho desnudo (contacto piel con piel) en una posicin erguida y [place] con [place] abrigada. Observe al beb para detectar seales de hambre, como el reflejo de bsqueda o succin, y acrquelo al pecho para su primera alimentacin. Etapa 3  Esta etapa comienza inmediatamente despus del nacimiento del beb y finaliza despus de la expulsin de la placenta.  Esta etapa puede durar de 5 a 30 minutos.  Despus del nacimiento del beb, puede sentir contracciones cuando el cuerpo expulsa la placenta y el tero se contrae para Radio broadcast assistant. Qu puedo esperar despus del [place] de parto y [place]?  Una vez que termine el trabajo de [place], se los controlar a usted y al beb atentamente para Warehouse manager la seguridad de  que ambos estn sanos y listos para ir a Higher education careers adviser.  Su equipo de atencin Art gallery manager cmo cuidarse y cuidar a su beb.  Usted y el beb permanecern en la misma habitacin (cohabitacin) durante su estada en el hospital. Esto estimular una vinculacin temprana y [place].  Puede seguir recibiendo lquidos o medicamentos por va intravenosa.  Se le controlar y Engineer, maintenance (IT) el tero con regularidad (masaje fndico).  Tendr algo de inflamacin y dolor en el abdomen, la vagina y la zona de la piel entre la abertura vaginal y el ano (perineo).  Si se le realiz una incisin cerca de la vagina (episiotoma) o si ha tenido Airline pilot parto, podran indicarle que se coloque compresas fras sobre la episiotoma o Art therapist. Esto ayuda a Engineer, materials y la hinchazn.  Es posible que le den una botella rociadora para que use cuando vaya al bao para higienizarse. Siga los pasos a continuacin para usar la botella rociadora: ? Antes de orinar, llene la botella rociadora con agua tibia. No use agua caliente. ? Despus de Geographical information systems officer, [place] an est sentada en el inodoro, use la botella rociadora para enjuagar el rea alrededor de la uretra y la abertura vaginal. Con esto podr limpiar cualquier rastro de orina y [place]. ? Llene la botella rociadora con agua limpia cada vez que vaya al bao.  Es normal tener hemorragia vaginal despus del [place]. Use un apsito sanitario para el sangrado vaginal y secrecin. Resumen  Parto vaginal significa que usted dar a luz empujando al beb fuera del canal del parto (vagina).  Su mdico puede monitorear las contracciones (monitoreo uterino) y la frecuencia cardaca del beb (monitoreo fetal).  Su mdico puede realizarle un examen fsico.  El trabajo de parto y el parto normales se dividen en tres etapas.  Una vez que termina el [place] de [place], se los controlar a usted y al beb atentamente hasta que estn listos para ir a  casa. Esta informacin no tiene Theme park manager el consejo del mdico. Asegrese de hacerle al mdico cualquier pregunta que tenga. Document Revised: 03/29/2017 Document Reviewed: 03/29/2017 Elsevier Patient Education  2020 [employer].

## 2019-07-03 NOTE — Lactation Note (Signed)
This note was copied from a baby's chart. Lactation Consultation Note  Patient Name: Boy Roslynn Holte RDEYC'X Date: 07/03/2019  Used RN who is approved for Spanish interpreting, Candace Cruise.  Mom is using a 24 mm nipple shield with breastfeeding.  Infant breastfed on the left breast about 20 minutes and then the right for about 10 more.Then dad fed 5 ml of colostrum mom had pumped.    Mom has positional stripe on right breast mom reports it is painful. Colostrum noted in shield.  Discussed comfort gels and mom reports she thinks she is okay with what she has.  Parents report they will be going to [facility].  Sent referrral for lactation to follow up with her there.   Urged to hand express prior to latching and after latching and rub expressed mother milk on nipples and air dry. Urged to post pump and feed back all expressed mothers milk past breastfeeding.  Mom able to hand express easily.  Mom has manual breast pump for home use. Reviewed recommended  Supplement amounts for baby';s age.  Parents have sheet to take home with them. Urged to feed on cue and 8-12 or more times day. Praised bf efforts.   Maternal Data    Feeding    LATCH Score                   Interventions    Lactation Tools Discussed/Used     Consult Status      Graison Leinberger Michaelle Copas 07/03/2019, 10:31 PM

## 2019-07-03 NOTE — Discharge Summary (Signed)
Postpartum Discharge Summary  Patient Name: Tracie [name] DOB: 07-05-1985 MRN: [id number]  Date of admission: 07/01/2019 Delivery date:07/01/2019  Delivering provider: Seabron Spates  Date of discharge: 07/03/2019  Admitting diagnosis: Normal labor [O80, Z37.9] Uterine contractions during pregnancy [O62.2] Intrauterine pregnancy: [redacted]w[redacted]d    Secondary diagnosis:  Active Problems:   Normal labor   Uterine contractions during pregnancy   Vaginal delivery  Additional problems: none     Discharge diagnosis: Term Pregnancy Delivered                                              Post partum procedures:none Augmentation: N/A Complications: None  Hospital course: Onset of Labor With Vaginal Delivery      34y.o. yo [id number] admitted in Active Labor on 07/01/2019. Patient had an uncomplicated labor course as follows:  Had been here several hours before for a labor eval and discharged home about 2am (1cm) Returned in full labor just before 8am and delivered quickly Membrane Rupture Time/Date: 7:47 AM ,07/01/2019   Delivery Method:Vaginal, Spontaneous  Episiotomy: None  Lacerations:  None  Patient had an uncomplicated postpartum course.  She is ambulating, tolerating a regular diet, passing flatus, and urinating well. Patient is discharged home in stable condition on 07/03/19.  Newborn Data: Birth date:07/01/2019  Birth time:7:51 AM  Gender:Female  Living status:Living  Apgars:9 ,9  Weight:3110 g   Magnesium Sulfate received: No BMZ received: No Rhophylac:N/A MMR:No T-DaP:Given prenatally Flu: N/A Transfusion:No  Physical exam  Vitals:   07/02/19 0552 07/02/19 1305 07/02/19 2300 07/03/19 0530  BP: 113/72 121/80 115/77 129/84  Pulse: 92 96 74 84  Resp: _0 Temp: 98.2 F (36.8 C) 98.4 F (36.9 C) 97.7 F (36.5 C) 98.1 F (36.7 C)  TempSrc: Oral Oral Oral Oral  SpO2: 96%  98% 97%  Weight:      Height:       General: alert, cooperative and no  distress Lochia: appropriate Uterine Fundus: firm Incision: N/A DVT Evaluation: No evidence of DVT seen on physical exam. Labs: Lab Results  Component Value Date   WBC 21.5 (H) 07/01/2019   HGB 13.9 07/01/2019   HCT 41.2 07/01/2019   MCV 92.6 07/01/2019   PLT 148 (L) 07/01/2019   No flowsheet data found. Edinburgh Score: Edinburgh Postnatal Depression Scale Screening Tool 07/03/2019  I have been able to laugh and see the funny side of things. 0  I have looked forward with enjoyment to things. 0  I have blamed myself unnecessarily when things went wrong. 0  I have been anxious or worried for no good reason. 0  I have felt scared or panicky for no good reason. 0  Things have been getting on top of me. 1  I have been so unhappy that I have had difficulty sleeping. 0  I have felt sad or miserable. 0  I have been so unhappy that I have been crying. 0  The thought of harming myself has occurred to me. 0  Edinburgh Postnatal Depression Scale Total 1      After visit meds:  Allergies as of 07/03/2019      Reactions   Pork-derived Products Hives      Medication List    TAKE these medications   albuterol 108 (90 Base) MCG/ACT inhaler Commonly known as: VENTOLIN HFA Inhale  2 puffs into the lungs every 4 (four) hours as needed for wheezing or shortness of breath.   ibuprofen 600 MG tablet Commonly known as: ADVIL Take 1 tablet (600 mg total) by mouth every 6 (six) hours.   multivitamin-prenatal 27-0.8 MG Tabs tablet Take 1 tablet by mouth daily at 12 noon.   OVER THE COUNTER MEDICATION Reports using ear drops, but does not know the name        Discharge home in stable condition Infant Feeding: Breast Infant Disposition:home with mother Discharge instruction: per After Visit Summary and Postpartum booklet. Activity: Advance as tolerated. Pelvic rest for 6 weeks.  Diet: routine diet Anticipated Birth Control: Nexplanon Postpartum Appointment:4 weeks Additional  Postpartum F/U: none Future Appointments: No future appointments. Follow up Visit: Follow-up Information    Department, [facility]. Schedule an appointment as soon as possible for a visit in 4 day(s).   Contact information: [place] [address] [phone]           07/03/2019 Stark Klein, MD

## 2019-09-03 ENCOUNTER — Other Ambulatory Visit: Payer: Self-pay

## 2019-09-03 ENCOUNTER — Ambulatory Visit (INDEPENDENT_AMBULATORY_CARE_PROVIDER_SITE_OTHER): Payer: Self-pay

## 2019-09-03 DIAGNOSIS — Z23 Encounter for immunization: Secondary | ICD-10-CM

## 2019-09-03 NOTE — Progress Notes (Signed)
   Covid-19 Vaccination Clinic  Name:  Tanishka Drolet    MRN: [id number] DOB: 07-Jun-1985  09/03/2019  Ms. Hinde was observed post Covid-19 immunization for 15 minutes without incident. She was provided with Vaccine Information Sheet and instruction to access the V-Safe system.   Ms. Fitting was instructed to call 911 with any severe reactions post vaccine: Marland Kitchen Difficulty breathing  . Swelling of face and throat  . A fast heartbeat  . A bad rash all over body  . Dizziness and weakness   Immunizations Administered    Name Date Dose VIS Date Route   Pfizer COVID-19 Vaccine 09/03/2019  3:10 PM 0.3 mL 03/27/2018 Intramuscular   Manufacturer: [employer], [employer]   Lot: [id number]   NDC: [address]-7

## 2019-09-24 ENCOUNTER — Ambulatory Visit: Payer: Self-pay

## 2019-09-28 ENCOUNTER — Ambulatory Visit (INDEPENDENT_AMBULATORY_CARE_PROVIDER_SITE_OTHER): Payer: Self-pay

## 2019-09-28 ENCOUNTER — Other Ambulatory Visit: Payer: Self-pay

## 2019-09-28 DIAGNOSIS — Z23 Encounter for immunization: Secondary | ICD-10-CM

## 2020-04-30 IMAGING — US US MFM FETAL NUCHAL TRANSLUCENCY
1 series · 14 of 28 positions shown · non-contrast
Comparison: none

[Series 1: us mfm fetal nuchal translucency · 14 of 46 slices shown]
[im 2/46]
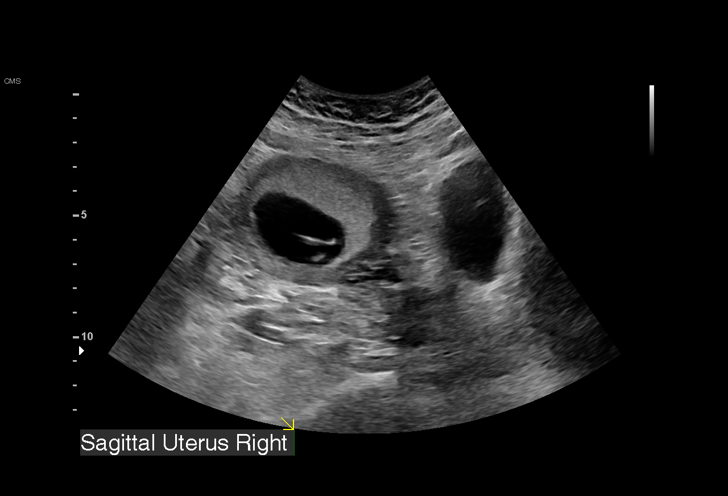
[im 6/46]
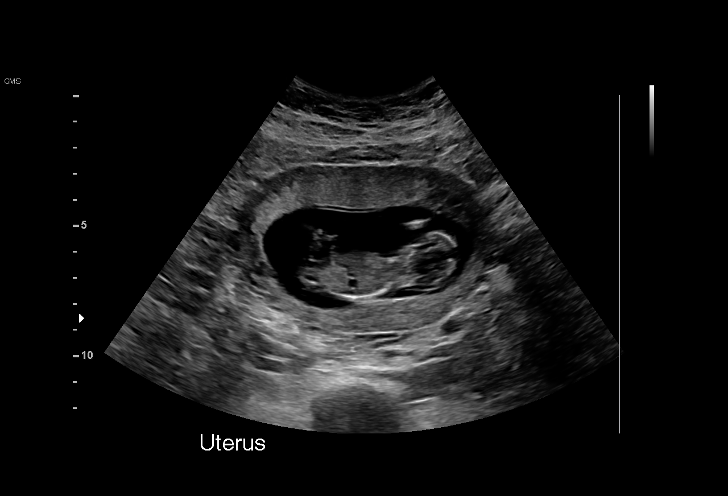
[im 9/46]
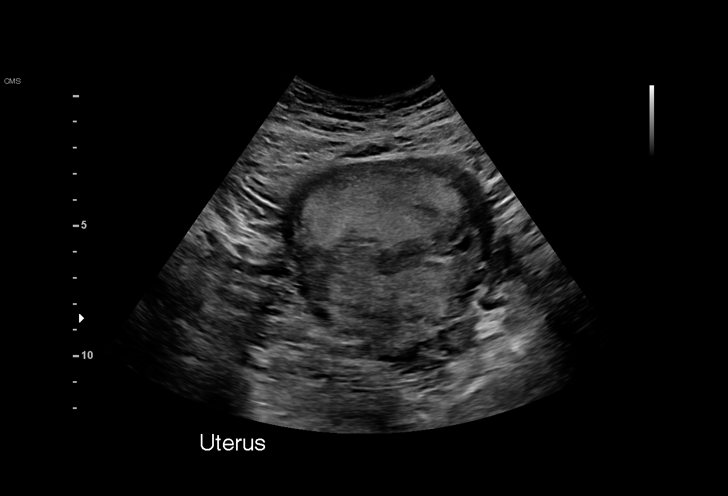
[im 12/46]
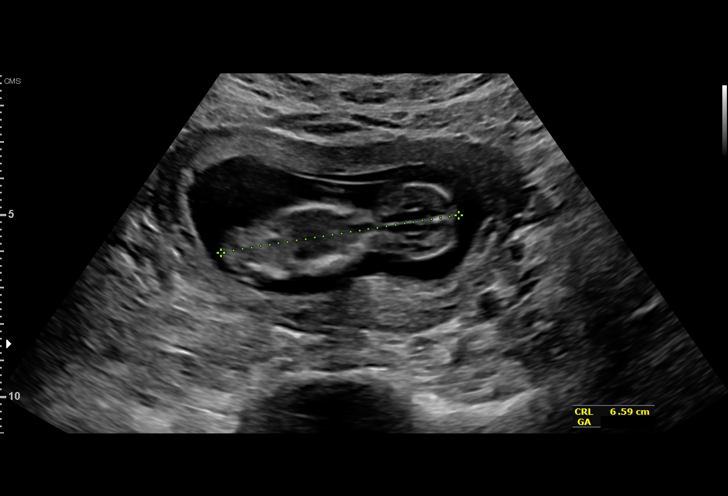
[im 16/46]
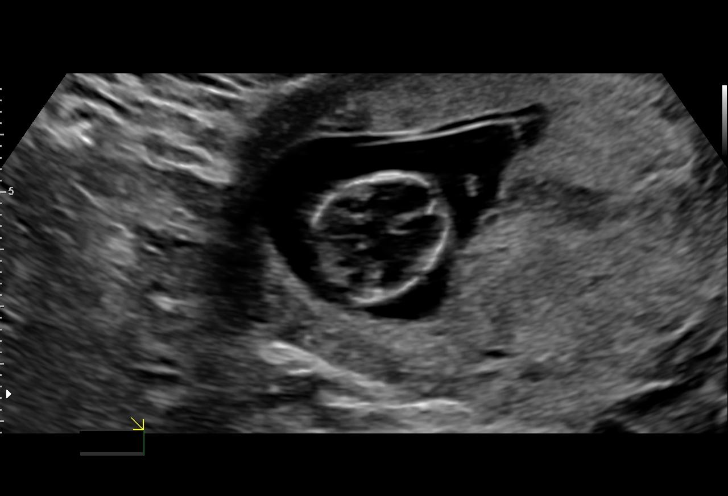
[im 19/46]
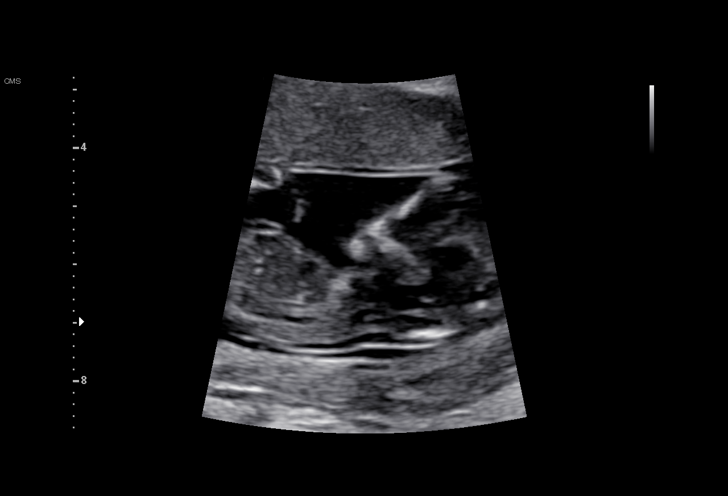
[im 22/46]
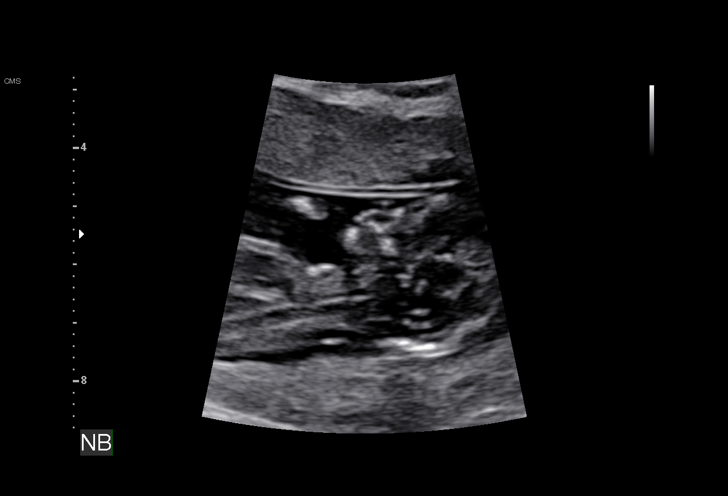
[im 26/46]
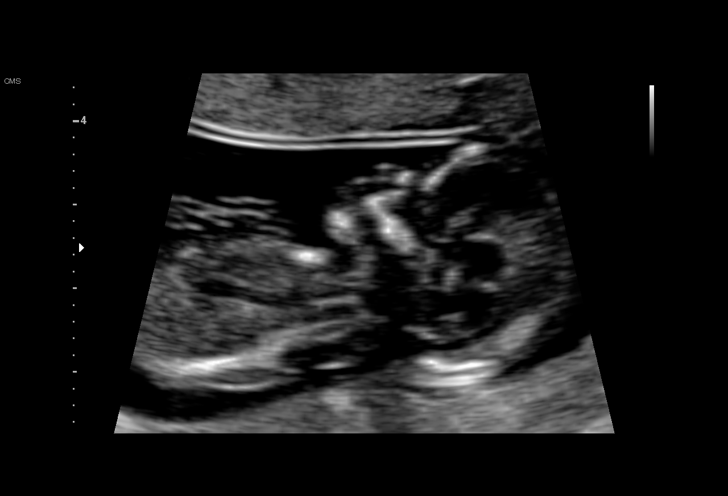
[im 29/46]
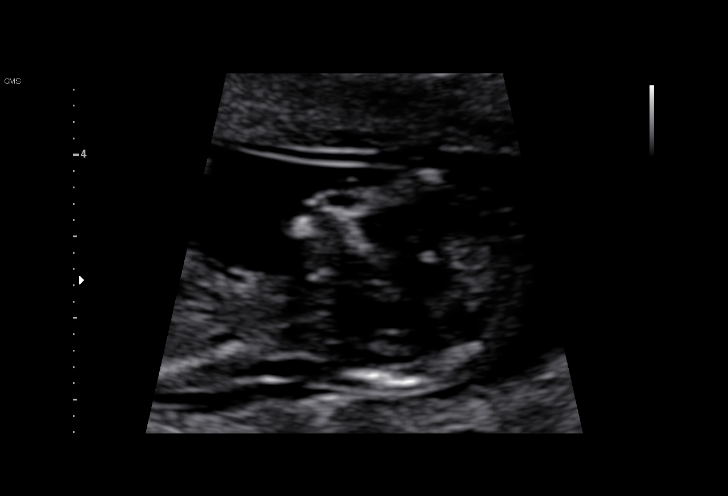
[im 32/46]
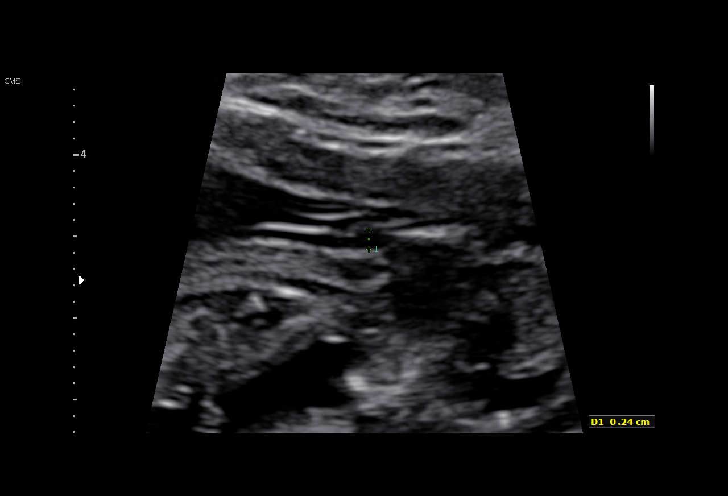
[im 36/46]
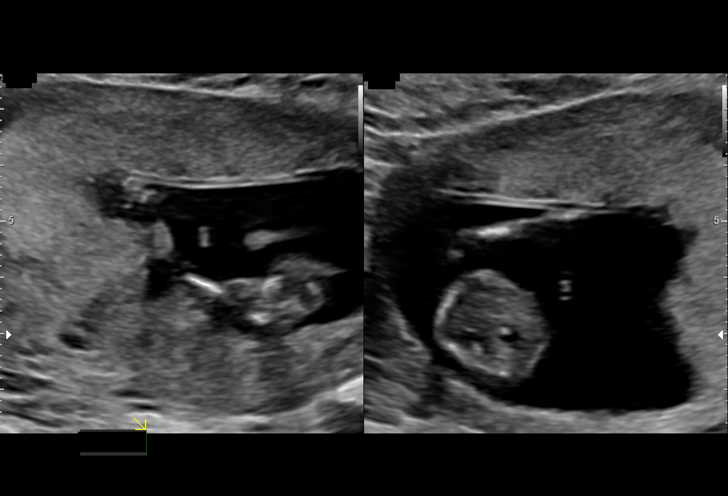
[im 39/46]
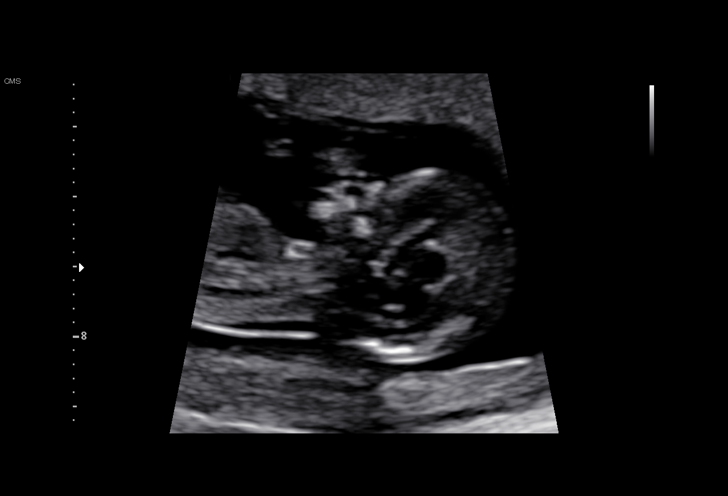
[im 42/46]
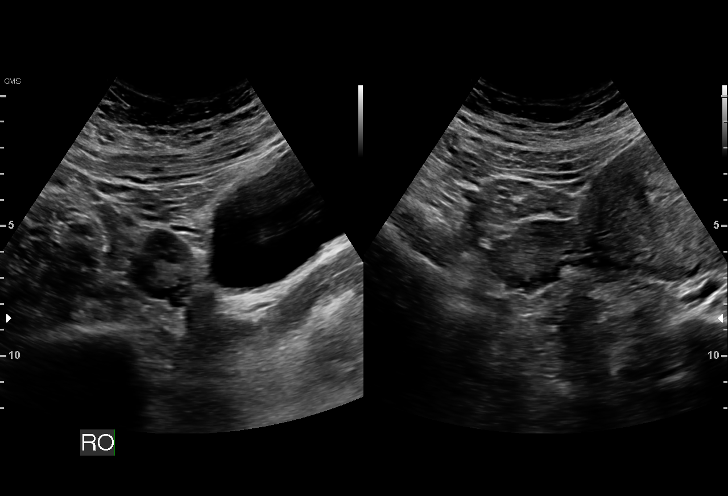
[im 46/46]
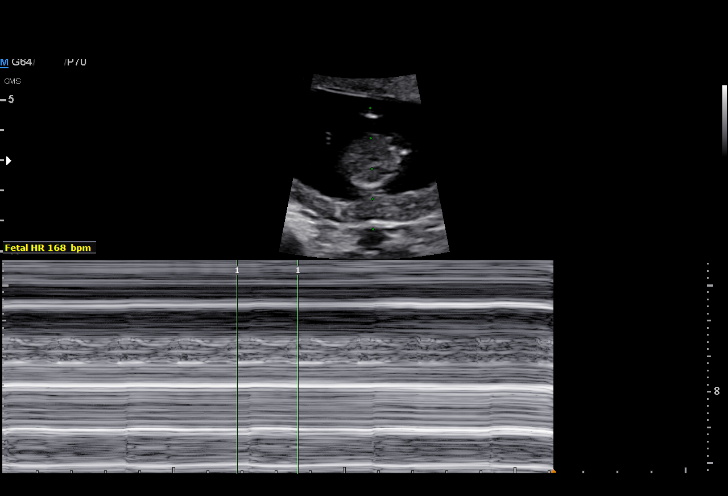

[14 of 28 positions shown; findings below may reference images not displayed]

[REDACTED]-
                   Faculty Physician

     TRANSLUCENCY
 ----------------------------------------------------------------------

 ----------------------------------------------------------------------
Indications

  Fetal abnormality - other known or suspected
  Encounter for nuchal translucency
  12 weeks gestation of pregnancy
 ----------------------------------------------------------------------
Fetal Evaluation

 Num Of Fetuses:         1
 Preg. Location:         Intrauterine
 Gest. Sac:              Intrauterine
 Fetal Pole:             Visualized
 Fetal Heart Rate(bpm):  168
 Cardiac Activity:       Observed
Biometry

 CRL:      65.9  mm     G. Age:  12w 5d                  EDD:   06/28/19
OB History

 Gravidity:    4         Term:   2        Prem:   0        SAB:   1
 TOP:          0       Ectopic:  0        Living: 2
Gestational Age

 LMP:           12w 6d        Date:  09/20/18                 EDD:   06/27/19
 Best:          12w 6d     Det. By:  LMP  (09/20/18)          EDD:   06/27/19
1st Trimester Genetic Sonogram Screening
 CRL:            65.9  mm    G. Age:   12w 5d                 EDD:   06/28/19
 Nuc Trans:       1.9  mm
 Nasal Bone:                 Present
Anatomy

 Cranium:               Appears normal         Bladder:                Visualized
 Choroid Plexus:        Visualized             Upper Extremities:      Visualized
 Stomach:               Appears normal, left   Lower Extremities:      Visualized
                        sided
Cervix Uterus Adnexa

 Cervix
 Closed

 Uterus
 No abnormality visualized.

 Left Ovary
 Within normal limits.

 Right Ovary
 Within normal limits.

 Cul De Sac
 No free fluid seen.

 Adnexa
 No abnormality visualized.
Comments

 This patient was seen for a first trimester nuchal translucency
 measurement.

 The crown-rump length measured today is consistent with her
 gestational age.

 The nuchal translucency measurement was 1.9 mm, which is
 within normal limits.

 The patient will have blood work drawn following today's
 ultrasound exam for a first trimester screening test.

 She should have a fetal anatomy scan performed at around
 19 weeks.

## 2021-01-15 ENCOUNTER — Encounter (INDEPENDENT_AMBULATORY_CARE_PROVIDER_SITE_OTHER): Payer: Self-pay

## 2021-11-09 ENCOUNTER — Encounter (HOSPITAL_COMMUNITY): Payer: Self-pay | Admitting: *Deleted

## 2021-11-09 ENCOUNTER — Ambulatory Visit (HOSPITAL_COMMUNITY)
Admission: EM | Admit: 2021-11-09 | Discharge: 2021-11-09 | Disposition: A | Discharge status: Home / Self Care | Payer: Self-pay | Attending: Internal Medicine | Admitting: Internal Medicine

## 2021-11-09 DIAGNOSIS — L509 Urticaria, unspecified: Secondary | ICD-10-CM

## 2021-11-09 DIAGNOSIS — R21 Rash and other nonspecific skin eruption: Secondary | ICD-10-CM

## 2021-11-09 DIAGNOSIS — T7840XA Allergy, unspecified, initial encounter: Secondary | ICD-10-CM

## 2021-11-09 MED ORDER — PREDNISONE 20 MG PO TABS: 40.0000 mg | ORAL_TABLET | Freq: Every day | ORAL | 0 refills | Status: DC

## 2021-11-09 MED ORDER — FAMOTIDINE 20 MG PO TABS
20.0000 mg | ORAL_TABLET | Freq: Every day | ORAL | Status: DC
  Administered 2021-11-09: 20 mg via ORAL

## 2021-11-09 MED ORDER — FAMOTIDINE 20 MG PO TABS
ORAL_TABLET | ORAL | Status: AC
  Filled 2021-11-09: qty 1

## 2021-11-09 MED ORDER — METHYLPREDNISOLONE SODIUM SUCC 125 MG IJ SOLR
80.0000 mg | Freq: Once | INTRAMUSCULAR | Status: AC
  Administered 2021-11-09: 80 mg via INTRAMUSCULAR

## 2021-11-09 MED ORDER — FAMOTIDINE 20 MG PO TABS: 20.0000 mg | ORAL_TABLET | Freq: Every day | ORAL | 0 refills | Status: DC

## 2021-11-09 MED ORDER — METHYLPREDNISOLONE SODIUM SUCC 125 MG IJ SOLR
INTRAMUSCULAR | Status: AC
  Filled 2021-11-09: qty 2

## 2021-11-09 MED ORDER — DIPHENHYDRAMINE HCL 25 MG PO TABS: 25.0000 mg | ORAL_TABLET | Freq: Four times a day (QID) | ORAL | 0 refills | Status: DC | PRN

## 2021-11-09 NOTE — ED Provider Notes (Addendum)
[facility]    CSN: [id number] Arrival date & time: 11/09/21  0802      History   Chief Complaint Chief Complaint  Patient presents with   Allergic Reaction    HPI Tracie [name] is a 36 y.o. female.   Patient presents urgent care for evaluation of diffuse allergic reaction that started last night all of a sudden when her lips became swollen progressing to itchy hives all over her body.  She does not know of any trigger to the allergic reaction and denies use of new laundry detergents, personal hygiene products, new foods, or exposure to poisonous plants.  No new bedding, insect bites, insect stings, or recent medication changes.  This is never happened in the past.  She denies shortness of breath, chest pain, and throat itching.  No nausea, vomiting, headache, fever/chills, or body aches.  Her face began to feel more "tight" this morning and she looked in the mirror to noticed that her upper lip was very swollen.  She has not attempted use of any over-the-counter medications prior to arrival urgent care.  The history is provided by the patient. A language interpreter was used (Spanish medical interpreter used for entirety of visit.).  Allergic Reaction   Past Medical History:  Diagnosis Date   Medical history non-contributory     Patient Active Problem List   Diagnosis Date Noted   Normal labor 07/01/2019   Uterine contractions during pregnancy 07/01/2019   Vaginal delivery 07/01/2019    Past Surgical History:  Procedure Laterality Date   NO PAST SURGERIES      OB History     Gravida  4   Para  3   Term  3   Preterm      AB  1   Living  3      SAB  1   IAB      Ectopic      Multiple  0   Live Births  3            Home Medications    Prior to Admission medications   Medication Sig Start Date End Date Taking? Authorizing Provider  diphenhydrAMINE (BENADRYL) 25 MG tablet Take 1 tablet (25 mg total) by mouth every 6 (six) hours as  needed for itching. 11/09/21  Yes Carlisle Beers, FNP  famotidine (PEPCID) 20 MG tablet Take 1 tablet (20 mg total) by mouth daily. 11/09/21  Yes Carlisle Beers, FNP  predniSONE (DELTASONE) 20 MG tablet Take 2 tablets (40 mg total) by mouth daily. 11/09/21  Yes Carlisle Beers, FNP  albuterol (PROVENTIL HFA;VENTOLIN HFA) 108 (90 Base) MCG/ACT inhaler Inhale 2 puffs into the lungs every 4 (four) hours as needed for wheezing or shortness of breath. Patient not taking: Reported on 12/19/2018 09/04/15   Charm Rings, MD  ibuprofen (ADVIL) 600 MG tablet Take 1 tablet (600 mg total) by mouth every 6 (six) hours. 07/02/19   Aviva Signs, CNM  OVER THE COUNTER MEDICATION Reports using ear drops, but does not know the name    [provider]  Prenatal Vit-Fe Fumarate-FA (MULTIVITAMIN-PRENATAL) 27-0.8 MG TABS tablet Take 1 tablet by mouth daily at 12 noon.    [provider]    Family History Family History  Problem Relation Age of Onset   Diabetes Mother    Pancreatic cancer Father    Diabetes Paternal Grandmother    Diabetes Paternal Aunt    Diabetes Other  Social History Social History   Tobacco Use   Smoking status: Never   Smokeless tobacco: Never  Vaping Use   Vaping Use: Never used  Substance Use Topics   Alcohol use: No   Drug use: No     Allergies   Pork-derived products   Review of Systems Review of Systems Per HPI  Physical Exam Triage Vital Signs ED Triage Vitals  Enc Vitals Group     BP 11/09/21 0819 (!) 143/88     Pulse Rate 11/09/21 0819 77     Resp 11/09/21 0819 18     Temp 11/09/21 0819 98.5 F (36.9 C)     Temp Source 11/09/21 0819 Oral     SpO2 11/09/21 0819 96 %     Weight --      Height --      Head Circumference --      Peak Flow --      Pain Score 11/09/21 0816 0     Pain Loc --      Pain Edu? --      Excl. in GC? --    No data found.  Updated Vital Signs BP (!) 143/88 (BP Location: Right Arm)    Pulse 77   Temp 98.5 F (36.9 C) (Oral)   Resp 18   LMP 11/01/2021 (Approximate)   SpO2 96%   Visual Acuity Right Eye Distance:   Left Eye Distance:   Bilateral Distance:    Right Eye Near:   Left Eye Near:    Bilateral Near:     Physical Exam Vitals and nursing note reviewed.  Constitutional:      Appearance: She is not ill-appearing or toxic-appearing.  HENT:     Head: Normocephalic and atraumatic.     Comments: Mild swelling to the upper lip and the maxillary facial regions bilaterally.  There is some erythema to the right maxillofacial region.     Right Ear: Hearing and external ear normal.     Left Ear: Hearing and external ear normal.     Nose: Nose normal. No congestion or rhinorrhea.     Mouth/Throat:     Lips: Pink.     Mouth: Mucous membranes are moist.     Pharynx: Oropharynx is clear. Uvula midline. No pharyngeal swelling or posterior oropharyngeal erythema.     Comments: No swelling or erythema to the posterior oropharynx.  Airway is intact. Eyes:     General: Lids are normal. Vision grossly intact. Gaze aligned appropriately.        Right eye: No discharge.        Left eye: No discharge.     Extraocular Movements: Extraocular movements intact.     Conjunctiva/sclera: Conjunctivae normal.     Right eye: Right conjunctiva is not injected.     Left eye: Left conjunctiva is not injected.     Pupils: Pupils are equal, round, and reactive to light.     Comments: EOMs normal and without dizziness/pain elicited with exam.  Neck:     Trachea: Trachea and phonation normal.  Cardiovascular:     Rate and Rhythm: Normal rate and regular rhythm.     Heart sounds: Normal heart sounds, S1 normal and S2 normal.  Pulmonary:     Effort: Pulmonary effort is normal. No respiratory distress.     Breath sounds: Normal breath sounds and air entry.     Comments: Clear to auscultation bilaterally without adventitious lung sounds.  No respiratory distress. Musculoskeletal:  Cervical back: Normal range of motion and neck supple. No edema.  Lymphadenopathy:     Cervical: No cervical adenopathy.  Skin:    General: Skin is warm and dry.     Capillary Refill: Capillary refill takes less than 2 seconds.     Findings: Rash present. Rash is urticarial.     Comments: Diffuse pruritic urticarial rash present to the trunk, bilateral lower extremities, bilateral upper extremities, neck, chest, and back.  No drainage or warmth to rash.  Neurological:     General: No focal deficit present.     Mental Status: She is alert and oriented to person, place, and time. Mental status is at baseline.     Cranial Nerves: No dysarthria or facial asymmetry.  Psychiatric:        Mood and Affect: Mood normal.        Speech: Speech normal.        Behavior: Behavior normal.        Thought Content: Thought content normal.        Judgment: Judgment normal.      UC Treatments / Results  Labs (all labs ordered are listed, but only abnormal results are displayed) Labs Reviewed - No data to display  EKG   Radiology No results found.  Procedures Procedures (including critical care time)  Medications Ordered in UC Medications  famotidine (PEPCID) tablet 20 mg (20 mg Oral Given 11/09/21 0845)  methylPREDNISolone sodium succinate (SOLU-MEDROL) 125 mg/2 mL injection 80 mg (80 mg Intramuscular Given 11/09/21 0842)    Initial Impression / Assessment and Plan / UC Course  I have reviewed the triage vital signs and the nursing notes.  Pertinent labs & imaging results that were available during my care of the patient were reviewed by me and considered in my medical decision making (see chart for details).   1.  Allergic reaction Unknown etiology of allergic reaction.  Solu-Medrol 80 mg injection given in clinic as well as 20 mg famotidine.  Advised patient to take prednisone 2 mg once daily for the next 5 days starting tomorrow morning with breakfast.  Advised to begin to take  famotidine once daily for the next 5 to 7 days as well for ongoing allergic reaction.  She may also take Benadryl 25 mg every 6 hours as needed for worsening allergic reaction symptoms, although has been advised to avoid taking this medication and drinking alcohol, driving, or going to work as it can make her very sleepy.  No indication for immediate referral to higher level of care for advanced imaging at this time as she is neurologically intact with a stable HEENT exam and hemodynamically stable vital signs.  Patient expresses agreement with this plan.  Discussed physical exam and available lab work findings in clinic with patient.  Counseled patient regarding appropriate use of medications and potential side effects for all medications recommended or prescribed today. Discussed red flag signs and symptoms of worsening condition,when to call the PCP office, return to urgent care, and when to seek higher level of care in the emergency department. Patient verbalizes understanding and agreement with plan. All questions answered. Patient discharged in stable condition.     Final Clinical Impressions(s) / UC Diagnoses   Final diagnoses:  Allergic reaction, initial encounter  Urticaria  Rash     Discharge Instructions      Le dimos una inyeccin de esteroides en la clnica hoy para ayudarlo con su erupcin e hinchazn facial debido a la reaccin alrgica  de la fuente desconocida.  Comience a tomar prednisona 40 mg una vez al [employer] prximos 5 das a partir de [place] con el desayuno. Tome este medicamento con alimentos para Surveyor, minerals. No tome ningn ibuprofeno mientras toma este medicamento.  Tome Pepcid una vez al da [employer] prximos 5 das tambin.  Tambin puede llevar a Benadryl de venta libre para ayudar a reducir an ms Production designer, theatre/television/film, pero no tome este medicamento y International aid/development worker, beba alcohol o vaya a trabajar, ya que puede darle mucho sueo.  Si  desarrolla sntomas nuevos o que empean o no mejora en los prximos 2 a 3 das, regrese. Si sus sntomas son graves, vaya a la sala de Multimedia programmer. Seguimiento con su proveedor de atencin primaria para una evaluacin adicional y el manejo de sus sntomas, as como las visitas continuas de Therapist, sports. Espero que se sienta mejor!  We gave you a steroid injection in the clinic today to help with your rash and facial swelling due to allergic reaction of unknown source.  Start taking prednisone 40 mg once daily for the next 5 days starting tomorrow with breakfast.  Take this medicine with food to avoid stomach upset.  Do not take any ibuprofen while taking this medicine.   Take Pepcid once daily for the next 5 days as well.   You may also take Benadryl over-the-counter to help further reduce allergic reaction but do not take this medicine and drive, drink alcohol, or go to work as it can make you very sleepy.  If you develop any new or worsening symptoms or do not improve in the next 2 to 3 days, please return.  If your symptoms are severe, please go to the emergency room.  Follow-up with your primary care provider for further evaluation and management of your symptoms as well as ongoing wellness visits.  I hope you feel better!     ED Prescriptions     Medication Sig Dispense Auth. Provider   famotidine (PEPCID) 20 MG tablet Take 1 tablet (20 mg total) by mouth daily. 15 tablet Joella Prince M, FNP   predniSONE (DELTASONE) 20 MG tablet Take 2 tablets (40 mg total) by mouth daily. 10 tablet Talbot Grumbling, FNP   diphenhydrAMINE (BENADRYL) 25 MG tablet Take 1 tablet (25 mg total) by mouth every 6 (six) hours as needed for itching. 30 tablet Talbot Grumbling, FNP      PDMP not reviewed this encounter.   Talbot Grumbling, FNP 11/09/21 0848    Talbot Grumbling, FNP 11/09/21 [phone]

## 2021-11-09 NOTE — Discharge Instructions (Addendum)
Le dimos una inyeccin de esteroides en la clnica hoy para ayudarlo con su erupcin e hinchazn facial debido a la reaccin alrgica de la fuente desconocida.  Comience a tomar prednisona 40 mg una vez al [employer] prximos 5 das a partir de [place] con el desayuno. Tome este medicamento con alimentos para Surveyor, minerals. No tome ningn ibuprofeno mientras toma este medicamento.  Tome Pepcid una vez al da [employer] prximos 5 das tambin.  Tambin puede llevar a Benadryl de venta libre para ayudar a reducir an ms Production designer, theatre/television/film, pero no tome este medicamento y International aid/development worker, beba alcohol o vaya a trabajar, ya que puede darle mucho sueo.  Si desarrolla sntomas nuevos o que empean o no mejora en los prximos 2 a 3 das, regrese. Si sus sntomas son graves, vaya a la sala de Multimedia programmer. Seguimiento con su proveedor de atencin primaria para una evaluacin adicional y el manejo de sus sntomas, as como las visitas continuas de Therapist, sports. Espero que se sienta mejor!  We gave you a steroid injection in the clinic today to help with your rash and facial swelling due to allergic reaction of unknown source.  Start taking prednisone 40 mg once daily for the next 5 days starting tomorrow with breakfast.  Take this medicine with food to avoid stomach upset.  Do not take any ibuprofen while taking this medicine.   Take Pepcid once daily for the next 5 days as well.   You may also take Benadryl over-the-counter to help further reduce allergic reaction but do not take this medicine and drive, drink alcohol, or go to work as it can make you very sleepy.  If you develop any new or worsening symptoms or do not improve in the next 2 to 3 days, please return.  If your symptoms are severe, please go to the emergency room.  Follow-up with your primary care provider for further evaluation and management of your symptoms as well as ongoing wellness visits.  I hope you feel better!

## 2021-11-09 NOTE — ED Triage Notes (Signed)
Pt states that last night she started to get a rash on her face, and this morning the rash has spread.she does say the rash is itching a lot.  She states her face and neck is swollen. She doesn't know what she could be allergic to. She doesn't complain of any throat irritation, no SOB or trouble breathing. She hasnt taken any meds.

## 2022-12-09 ENCOUNTER — Other Ambulatory Visit: Payer: Self-pay

## 2022-12-09 DIAGNOSIS — N644 Mastodynia: Secondary | ICD-10-CM

## 2022-12-09 NOTE — Addendum Note (Signed)
Addended by: Lucilla Lame E on: 12/09/2022 11:33 AM   Modules accepted: Orders

## 2023-01-19 ENCOUNTER — Ambulatory Visit: Payer: Self-pay

## 2023-01-19 ENCOUNTER — Ambulatory Visit
Admission: RE | Admit: 2023-01-19 | Discharge: 2023-01-19 | Disposition: A | Discharge status: Home / Self Care | Payer: No Typology Code available for payment source | Source: Ambulatory Visit | Attending: Obstetrics and Gynecology | Admitting: Obstetrics and Gynecology

## 2023-01-19 ENCOUNTER — Ambulatory Visit: Payer: Self-pay | Admitting: Hematology and Oncology

## 2023-01-19 ENCOUNTER — Other Ambulatory Visit: Payer: Self-pay

## 2023-01-19 VITALS — BP 130/90 | Ht 62.0 in | Wt 176.0 lb

## 2023-01-19 DIAGNOSIS — N644 Mastodynia: Secondary | ICD-10-CM

## 2023-01-19 NOTE — Patient Instructions (Signed)
Taught Tracie [name] about self breast awareness and gave educational materials to take home. Patient did not need a Pap smear today due to last Pap smear was unknown per patient and done at the [facility].  Let her know [facility] will cover Pap smears every 5 years unless has a history of abnormal Pap smears. Referred patient to the [facility] for diagnostic mammogram. Appointment scheduled for 01/19/2023. Patient aware of appointment and will be there. Let patient know will follow up with her within the next couple weeks with results. Tracie [name] verbalized understanding.  Pascal Lux, NP 10:11 AM

## 2023-01-19 NOTE — Progress Notes (Signed)
[name] is a 37 y.o. female who presents to [facility] clinic today with complaint of bilateral breast pain.    Pap Smear: Pap not smear completed today. Last Pap smear was at [facility] and was normal. Per patient has no history of an abnormal Pap smear. Last Pap smear result is not available in Epic.   Physical exam: Breasts Breasts symmetrical. No skin abnormalities bilateral breasts. No nipple retraction bilateral breasts. No nipple discharge bilateral breasts. No lymphadenopathy. No lumps palpated bilateral breasts.       Pelvic/Bimanual Pap is not indicated today    Smoking History: Patient has never smoked and was not referred to quit line.    Patient Navigation: Patient education provided. Access to services provided for patient through [facility] program. Natale Lay interpreter provided. No transportation provided   Colorectal Cancer Screening: Per patient has never had colonoscopy completed No complaints today.    Breast and Cervical Cancer Risk Assessment: Patient does not have family history of breast cancer, known genetic mutations, or radiation treatment to the chest before age 80. Patient does not have history of cervical dysplasia, immunocompromised, or DES exposure in-utero.  Risk Scores as of Encounter on 01/19/2023     Tracie [name]           5-year 0.13%   Lifetime 3.64%            Last calculated by Caprice Red, CMA on 01/19/2023 at  9:50 AM        A: [facility] exam without pap smear No complaints with benign exam.  P: Referred patient to the [facility] for a diagnostic mammogram. Appointment scheduled 01/19/2023.  Tracie [name] A, NP 01/19/2023 10:02 AM

## 2023-04-04 ENCOUNTER — Encounter: Payer: Self-pay | Admitting: Obstetrics & Gynecology
